# Patient Record
Sex: Female | Born: 1940 | Race: White | Hispanic: No | Marital: Married | Smoking: Never smoker
Health system: Southern US, Community
[De-identification: ages and names within clinical notes are randomized; demographics above are authoritative.]

## PROBLEM LIST (undated history)

## (undated) DIAGNOSIS — F329 Major depressive disorder, single episode, unspecified: Secondary | ICD-10-CM

## (undated) DIAGNOSIS — E119 Type 2 diabetes mellitus without complications: Secondary | ICD-10-CM

## (undated) DIAGNOSIS — F32A Depression, unspecified: Secondary | ICD-10-CM

## (undated) DIAGNOSIS — E079 Disorder of thyroid, unspecified: Secondary | ICD-10-CM

## (undated) DIAGNOSIS — M549 Dorsalgia, unspecified: Secondary | ICD-10-CM

## (undated) DIAGNOSIS — M1990 Unspecified osteoarthritis, unspecified site: Secondary | ICD-10-CM

## (undated) DIAGNOSIS — H269 Unspecified cataract: Secondary | ICD-10-CM

## (undated) DIAGNOSIS — I10 Essential (primary) hypertension: Secondary | ICD-10-CM

## (undated) HISTORY — PX: OTHER SURGICAL HISTORY: SHX169

## (undated) HISTORY — DX: Depression, unspecified: F32.A

## (undated) HISTORY — PX: ABDOMINAL HYSTERECTOMY: SHX81

## (undated) HISTORY — DX: Unspecified cataract: H26.9

## (undated) HISTORY — PX: EYE SURGERY: SHX253

## (undated) HISTORY — DX: Essential (primary) hypertension: I10

## (undated) HISTORY — DX: Type 2 diabetes mellitus without complications: E11.9

## (undated) HISTORY — PX: THYROID SURGERY: SHX805

## (undated) HISTORY — DX: Unspecified osteoarthritis, unspecified site: M19.90

## (undated) HISTORY — PX: SHOULDER SURGERY: SHX246

## (undated) HISTORY — DX: Dorsalgia, unspecified: M54.9

## (undated) HISTORY — DX: Disorder of thyroid, unspecified: E07.9

## (undated) SURGERY — ESOPHAGOGASTRODUODENOSCOPY (EGD) WITH PROPOFOL
Anesthesia: Monitor Anesthesia Care

---
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
## 2003-12-23 ENCOUNTER — Ambulatory Visit (HOSPITAL_COMMUNITY): Admission: RE | Admit: 2003-12-23 | Discharge: 2003-12-23 | Payer: Self-pay | Admitting: Pulmonary Disease

## 2004-03-13 ENCOUNTER — Ambulatory Visit (HOSPITAL_COMMUNITY): Admission: RE | Admit: 2004-03-13 | Discharge: 2004-03-13 | Payer: Self-pay | Admitting: Pulmonary Disease

## 2004-07-02 ENCOUNTER — Ambulatory Visit: Payer: Self-pay | Admitting: Orthopedic Surgery

## 2004-07-30 ENCOUNTER — Ambulatory Visit: Payer: Self-pay | Admitting: Orthopedic Surgery

## 2004-08-07 ENCOUNTER — Encounter: Admission: RE | Admit: 2004-08-07 | Discharge: 2004-08-07 | Payer: Self-pay | Admitting: Orthopedic Surgery

## 2004-08-21 ENCOUNTER — Encounter: Admission: RE | Admit: 2004-08-21 | Discharge: 2004-08-21 | Payer: Self-pay | Admitting: Orthopedic Surgery

## 2004-08-31 ENCOUNTER — Ambulatory Visit: Payer: Self-pay | Admitting: Cardiology

## 2004-09-04 ENCOUNTER — Encounter: Admission: RE | Admit: 2004-09-04 | Discharge: 2004-09-04 | Payer: Self-pay | Admitting: Orthopedic Surgery

## 2004-09-21 ENCOUNTER — Ambulatory Visit: Payer: Self-pay | Admitting: Cardiology

## 2004-09-25 ENCOUNTER — Inpatient Hospital Stay (HOSPITAL_BASED_OUTPATIENT_CLINIC_OR_DEPARTMENT_OTHER): Admission: RE | Admit: 2004-09-25 | Discharge: 2004-09-25 | Payer: Self-pay | Admitting: Cardiology

## 2004-09-25 ENCOUNTER — Ambulatory Visit: Payer: Self-pay | Admitting: Cardiology

## 2004-10-02 ENCOUNTER — Ambulatory Visit: Payer: Self-pay | Admitting: Cardiology

## 2004-10-12 ENCOUNTER — Ambulatory Visit: Payer: Self-pay | Admitting: Orthopedic Surgery

## 2005-01-14 ENCOUNTER — Ambulatory Visit (HOSPITAL_COMMUNITY): Admission: RE | Admit: 2005-01-14 | Discharge: 2005-01-14 | Payer: Self-pay | Admitting: Neurosurgery

## 2005-02-24 ENCOUNTER — Encounter: Admission: RE | Admit: 2005-02-24 | Discharge: 2005-02-24 | Payer: Self-pay | Admitting: Endocrinology

## 2005-02-24 ENCOUNTER — Other Ambulatory Visit: Admission: RE | Admit: 2005-02-24 | Discharge: 2005-02-24 | Payer: Self-pay | Admitting: Interventional Radiology

## 2005-05-27 ENCOUNTER — Inpatient Hospital Stay (HOSPITAL_COMMUNITY): Admission: RE | Admit: 2005-05-27 | Discharge: 2005-05-29 | Payer: Self-pay | Admitting: General Surgery

## 2005-05-27 ENCOUNTER — Encounter (INDEPENDENT_AMBULATORY_CARE_PROVIDER_SITE_OTHER): Payer: Self-pay | Admitting: Specialist

## 2007-01-17 ENCOUNTER — Encounter: Admission: RE | Admit: 2007-01-17 | Discharge: 2007-01-17 | Payer: Self-pay | Admitting: Neurosurgery

## 2009-07-14 ENCOUNTER — Encounter: Admission: RE | Admit: 2009-07-14 | Discharge: 2009-07-14 | Payer: Self-pay | Admitting: Neurosurgery

## 2011-01-08 NOTE — Op Note (Signed)
NAME:  Fuller, Wendy                   ACCOUNT NO.:  [id number]   MEDICAL RECORD NO.:  [id number]          PATIENT TYPE:  AMB   LOCATION:  SDS                          FACILITY:  [facility]   PHYSICIAN:  Adolph Pollack, M.D.DATE OF BIRTH:  1940-10-02   DATE OF PROCEDURE:  05/27/2005  DATE OF DISCHARGE:                                 OPERATIVE REPORT   PREOPERATIVE DIAGNOSIS:  Follicular lesion, left thyroid gland.   POSTOPERATIVE DIAGNOSIS:  Follicular lesion, left thyroid gland.   PROCEDURE:  Left thyroidectomy.   SURGEON:  Adolph Pollack, M.D.   ASSISTANT:  Gabrielle Dare. Janee Morn, M.D.   ANESTHESIA:  General.   INDICATIONS:  Wendy Fuller is a 70 year old female with cervical radiculopathy  requiring cervical surgery. An MRI of her neck also demonstrated a thyroid  mass. This was biopsied and showed some follicular cells. No obvious  malignancy. She now presents for combined procedure of a left thyroidectomy  and the cervical surgery per Dr. Venetia Maxon.   TECHNIQUE:  She is seen in the holding area and the left side of the neck  marked with my initials. She was then brought to the operating room, placed  supine on the operating table, and a general anesthetic was administered.  Dr. Venetia Maxon positioned her into slight neck extension. The neck was then  sterilely prepped and draped. A transverse incision was made from the mid  portion of the left sternocleidomastoid muscle cross-tracing around just to  the right of the midline. The subcutaneous tissue and platysma muscle were  divided. Subplatysmal flaps were then made superiorly to the laryngeal  cartilage and inferiorly to the suprasternal notch. Next to the superficial  area, the deep cervical fascia between the strap muscles was then identified  and divided. The left strap muscles were dissected free from the underlying  thyroid gland and the enlarged thyroid mass/lesion was palpated inferiorly.  I began by mobilizing the inferior aspect  of the thyroid gland with  dissection on the thyroid capsule and dividing the inferior thyroidal  vessels between clips. Following this, I then approached the superior pole  of the thyroid gland with dissection on the thyroid capsule. I divided the  vessels low on the thyroid gland and was able to then mobilize the superior  aspect of thyroid gland. I then identified the middle thyroidal vessels. I  identified the superior and inferior parathyroid glands and preserved the  lateral blood supply while dissecting them free from the thyroid gland. The  recurrent laryngeal nerve was then identified and traced up to its insertion  to the cricothyroid muscle. I then divided the middle thyroidal vessels  between clips and then dissected the left thyroid gland off the trachea. I  clamped the isthmus at its adjoining with the right thyroid gland and then  resected the isthmus and the left thyroid. The isthmus was oversewn with a 3-  0 Vicryl suture and was hemostatic. Specimen was sent for pathology and a  hyperplastic lesion was noted but no evidence of malignancy.   There was some bleeding just adjacent to the  recurrent laryngeal nerve which  was controlled with direct pressure and Surgicel. Following this, I then  reapproximated the strap muscles with interrupted 3-0 Vicryl sutures. There  was some bleeding from one of the anterior jugular veins during the closure  and I removed the suture and then ligated  that vein, and then reapproximated the strap muscles. At this point,  hemostasis was adequate. Dr. Venetia Maxon then proceeded with his operation which  will be dictated in the second note. There were no apparent complications  during her left thyroid lobectomy.      Adolph Pollack, M.D.  Electronically Signed     TJR/MEDQ  D:  05/27/2005  T:  05/27/2005  Job:  [id number]   cc:   Danae Orleans. Venetia Maxon, M.D.  Fax: [phone]

## 2011-01-08 NOTE — Cardiovascular Report (Signed)
NAMEJohnette, Wendy Fuller                   ACCOUNT NO.:  [id number]   MEDICAL RECORD NO.:  [id number]          PATIENT TYPE:  OIB   LOCATION:  6501                         FACILITY:  [facility]   PHYSICIAN:  Rollene Rotunda, M.D.   DATE OF BIRTH:  1941-03-24   DATE OF PROCEDURE:  09/25/2004  DATE OF DISCHARGE:                              CARDIAC CATHETERIZATION   PRIMARY CARE PHYSICIAN:  Doreen Beam, M.D.   REASON FOR PRESENTATION:  Evaluate patient with chest pain suggestive of  unstable angina.   PROCEDURE NOTE:  Left heart catheterization was performed via the right  femoral artery.  The artery was cannulated using anterior wall puncture.  A  #4-French arterial sheath was inserted via the modified Seldinger technique.  Preformed Judkins and a pigtail catheter were utilized.  The patient  tolerated the procedure well and left the laboratory in stable condition.   RESULTS:  HEMODYNAMICS:  LV 147/16.  AO 149/75.   CORONARIES:  The left main was normal.   The LAD was normal.  There was a large first diagonal which was normal.  A  second diagonal was moderate size and normal.  The AV groove was normal.  The ramus intermediate was large and normal.  OM1 was large and normal.  OM2  was large and normal.  The posterolateral was small and normal.   The right coronary artery was a large, dominant vessel and normal throughout  its course.  There was a small PDA which was normal.  Posterolateral was  small and normal.   LEFT VENTRICULOGRAM:  The left ventriculogram was obtained in the RAO  projection.  The EF was 55% with normal wall motion.   CONCLUSION:  Normal coronaries.  Low normal left ventricular function.   PLAN:  No further cardiac work-up is planned.  Patient will follow up with  her primary care doctor for work up of probable nonanginal etiologies of  chest pain.      JH/MEDQ  D:  09/25/2004  T:  09/25/2004  Job:  [id number]

## 2011-01-08 NOTE — Op Note (Signed)
NAMEMarilla Fuller, Wendy Fuller                   ACCOUNT NO.:  [id number]   MEDICAL RECORD NO.:  [id number]          PATIENT TYPE:  OIB   LOCATION:  3023                         FACILITY:  [facility]   PHYSICIAN:  Danae Orleans. Venetia Maxon, M.D.  DATE OF BIRTH:  1941/05/20   DATE OF PROCEDURE:  05/27/2005  DATE OF DISCHARGE:                                 OPERATIVE REPORT   PREOPERATIVE DIAGNOSES:  1.  Herniated cervical disk with,  2.  Cervical spondylosis,  3.  Degenerative disk disease; and,  4.  Radiculopathy of cervical 5-6 and cervical 6-7 levels.   POSTOPERATIVE DIAGNOSES:  1.  Herniated cervical disk with,  2.  Cervical spondylosis.  3.  Degenerative disk disease; and,  4.  Radiculopathy of cervical 5-6 and cervical 6-7 levels.   OPERATION PERFORMED:  Anterior cervical decompression and fusion, cervical 5-  6 and cervical 6-7 levels with Peak interbody cages with morcellized bone  autograft, and anterior cervical plate.   SURGEON:  Danae Orleans. Venetia Maxon, M.D.   ASSISTANT:  Clydene Fake, M.D.   ANESTHESIA:  General endotracheal anesthesia.   ESTIMATED BLOOD LOSS:  The estimated blood loss was 50 mL.   COMPLICATIONS:  None.   DISPOSITION:  To recovery.   INDICATIONS:  Wendy Fuller is a 70 year old woman with severe cervical  spondylosis and left upper extremity pain with foraminal stenosis at C5-6  and C6-7  levels.  She has some mild degenerative changes at C4-5 and C3-4,  but these were felt not to be severe enough to warranted surgical  intervention at this point.  She also has a thyroid mass and it was elected  to perform thyroid lobectomy, which Dr. Abbey Chatters performed and will be  dictated separately at the same time as the anterior cervical decompression  and fusion.   DESCRIPTION OF OPERATION:  After the satisfactory and uncomplicated  induction of general endotracheal anesthesia the patient was placed on a  horseshoe head holder with her neck in slight extension and she was placed  in 10 pounds of Halter traction.  Then I met with Dr. Abbey Chatters, we  conferred on placement of incision and then he performed his exposure.  After the thyroid lobectomy was performed, I made an opening along the  anterior border of the sternocleidomastoid muscle and gained access in the  usual fashion on the left side of the midline to the anterior cervical  spine.  A bent spinal needle was placed at the C5-6 level and intraoperative  x-ray confirmed this to be the C5-6 level.  Subsequently the longus coli  muscles were taken down from the anterior cervical spine using  electrocautery and Key elevator from the C5 through C7 levels, and  __________  retractor was placed along with up-down retractor to facilitate  exposure.   The C5-6 and C6-7 levels were highly degenerated.  The ventral osteophytes  were removed with the Leksell rongeurs.  The disk spaces were markedly  collapsed, particularly the C5-6 level.  After the disk spaces were incised  at each level and disk material was removed with a  variety of pituitary  rongeurs and Carlens curets the disk space spreader was placed initially at  the C6-7 level where highly degenerated disk space was __________  to  residual disk material and similarly at the C5-6 level.   Subsequently the microscope was brought into the field.  Using the high-  speed drill endplates at C6 and C7 were decorticated and uncinate spurs were  drilled down.  A 2 mm gold tip Kerrison rongeur was used to remove  additional bone and ligamentous tissue, and the C7 nerve root, particularly  on the left, was widely decompressed.  The C7 nerve root on the right was  also decompressed as was the central spinal cord dura.  Hemostasis was  obtained with Gelfoam soaked in thrombin, and subsequently after trial  sizing a 7 mm Peak interbody cage was selected, packed with morcellized bone  autograft, which was retained from the drillings of the endplates and packed  within  the center of the graft.  This was done and inserted in the  interspace and countersunk appropriately.  Attention was then turned to the  C5-6 level where similar decompression was performed, and again a large  uncinate spur was drilled down with resultant significant decompression of  the C6 nerve root, particularly on the left, and the C6 nerve root was also  decompressed on the right.  The central spinal cord dura was decompressed as  well.  Similarly sized interbody Peak cage was packed with morcellized bone  autograft, tamped into the interspace and countersunk appropriately.  Subsequently the Halter traction was removed.  A 31 mm anterior cervical  plate was then selected and affixed to the anterior cervical spine using  variable angled 14-mm screws; two at C5, two at C6  and two C7.  All screws  had excellent purchase.  Locking mechanisms were engaged after final x-ray  demonstrated well positioned anterior cervical plate and screws.   The wound was then copiously irrigated with bacitracin and saline.  The soft  tissues were inspected and found to be in good repair.  The platysmal layer  was then reapproximated with 3-0 Vicryl sutures.  The skin edges were  reapproximated with interrupted 3-0 Vicryl subcuticular stitch.  The wound  was dressed with benzoin, Steri-strips, telfa, gauze, and tape.   The patient was extubated in the operating room and taken to the recovery  room in stable and satisfactory condition having tolerated the operation  well.   COUNTS:  The counts were correct at the end of the case.      Danae Orleans. Venetia Maxon, M.D.  Electronically Signed     JDS/MEDQ  D:  05/27/2005  T:  05/27/2005  Job:  [id number]

## 2011-05-25 DIAGNOSIS — R079 Chest pain, unspecified: Secondary | ICD-10-CM

## 2011-06-08 ENCOUNTER — Other Ambulatory Visit (HOSPITAL_COMMUNITY): Payer: Self-pay | Admitting: Neurosurgery

## 2011-06-08 DIAGNOSIS — M542 Cervicalgia: Secondary | ICD-10-CM

## 2011-06-08 DIAGNOSIS — M545 Low back pain: Secondary | ICD-10-CM

## 2011-06-08 DIAGNOSIS — M546 Pain in thoracic spine: Secondary | ICD-10-CM

## 2011-06-24 ENCOUNTER — Other Ambulatory Visit (HOSPITAL_COMMUNITY): Payer: Self-pay | Admitting: Neurosurgery

## 2011-06-24 DIAGNOSIS — M542 Cervicalgia: Secondary | ICD-10-CM

## 2011-06-24 DIAGNOSIS — M545 Low back pain: Secondary | ICD-10-CM

## 2011-06-24 DIAGNOSIS — M546 Pain in thoracic spine: Secondary | ICD-10-CM

## 2011-06-25 ENCOUNTER — Other Ambulatory Visit (HOSPITAL_COMMUNITY): Payer: Medicare Other

## 2011-07-09 ENCOUNTER — Other Ambulatory Visit: Payer: Self-pay | Admitting: Neurosurgery

## 2011-07-12 ENCOUNTER — Ambulatory Visit (HOSPITAL_COMMUNITY)
Admission: RE | Admit: 2011-07-12 | Discharge: 2011-07-12 | Disposition: A | Discharge status: Home / Self Care | Payer: Medicare Other | Source: Ambulatory Visit | Attending: Neurosurgery | Admitting: Neurosurgery

## 2011-07-12 DIAGNOSIS — M542 Cervicalgia: Secondary | ICD-10-CM

## 2011-07-12 DIAGNOSIS — M545 Low back pain: Secondary | ICD-10-CM

## 2011-07-12 DIAGNOSIS — M546 Pain in thoracic spine: Secondary | ICD-10-CM

## 2011-07-12 DIAGNOSIS — M48061 Spinal stenosis, lumbar region without neurogenic claudication: Secondary | ICD-10-CM | POA: Insufficient documentation

## 2011-07-12 DIAGNOSIS — M519 Unspecified thoracic, thoracolumbar and lumbosacral intervertebral disc disorder: Secondary | ICD-10-CM | POA: Insufficient documentation

## 2011-07-12 DIAGNOSIS — M4802 Spinal stenosis, cervical region: Secondary | ICD-10-CM | POA: Insufficient documentation

## 2011-07-12 DIAGNOSIS — Z981 Arthrodesis status: Secondary | ICD-10-CM | POA: Insufficient documentation

## 2011-07-12 DIAGNOSIS — M549 Dorsalgia, unspecified: Secondary | ICD-10-CM | POA: Insufficient documentation

## 2011-07-12 DIAGNOSIS — M4120 Other idiopathic scoliosis, site unspecified: Secondary | ICD-10-CM | POA: Insufficient documentation

## 2011-07-12 DIAGNOSIS — M5020 Other cervical disc displacement, unspecified cervical region: Secondary | ICD-10-CM | POA: Insufficient documentation

## 2011-07-12 MED ORDER — IOHEXOL 300 MG/ML  SOLN
10.0000 mL | Freq: Once | INTRAMUSCULAR | Status: AC | PRN
  Administered 2011-07-12: 10 mL via INTRATHECAL

## 2011-07-12 MED ORDER — OXYCODONE-ACETAMINOPHEN 5-325 MG PO TABS
ORAL_TABLET | ORAL | Status: AC
  Filled 2011-07-12: qty 1

## 2011-07-12 MED ORDER — ONDANSETRON HCL 4 MG/2ML IJ SOLN: 4.0000 mg | Freq: Four times a day (QID) | INTRAMUSCULAR | Status: DC | PRN

## 2011-07-12 MED ORDER — OXYCODONE-ACETAMINOPHEN 5-325 MG PO TABS
ORAL_TABLET | ORAL | Status: AC
  Administered 2011-07-12: 1 via ORAL
  Filled 2011-07-12: qty 1

## 2011-07-12 MED ORDER — OXYCODONE-ACETAMINOPHEN 5-325 MG PO TABS
1.0000 | ORAL_TABLET | ORAL | Status: DC | PRN
  Administered 2011-07-12 (×2): 1 via ORAL

## 2011-07-12 MED ORDER — DIAZEPAM 5 MG PO TABS
10.0000 mg | ORAL_TABLET | Freq: Once | ORAL | Status: AC
  Administered 2011-07-12: 10 mg via ORAL

## 2011-07-12 NOTE — Procedures (Signed)
Lumbar puncture L4/5. Omnipaque 300.

## 2011-11-12 ENCOUNTER — Ambulatory Visit (INDEPENDENT_AMBULATORY_CARE_PROVIDER_SITE_OTHER): Payer: Medicare Other | Admitting: Urology

## 2011-11-12 DIAGNOSIS — N952 Postmenopausal atrophic vaginitis: Secondary | ICD-10-CM

## 2011-11-12 DIAGNOSIS — R300 Dysuria: Secondary | ICD-10-CM

## 2012-02-11 ENCOUNTER — Ambulatory Visit (INDEPENDENT_AMBULATORY_CARE_PROVIDER_SITE_OTHER): Payer: Medicare Other | Admitting: Urology

## 2012-02-11 DIAGNOSIS — N3941 Urge incontinence: Secondary | ICD-10-CM

## 2012-02-11 DIAGNOSIS — N952 Postmenopausal atrophic vaginitis: Secondary | ICD-10-CM

## 2012-05-12 ENCOUNTER — Ambulatory Visit: Payer: Medicare Other | Admitting: Urology

## 2012-08-04 ENCOUNTER — Ambulatory Visit: Payer: Medicare Other | Admitting: Urology

## 2012-08-11 ENCOUNTER — Ambulatory Visit: Payer: Medicare Other | Admitting: Urology

## 2012-11-30 IMAGING — CT CT L SPINE W/ CM
3 of 12 series · 10 of 34 positions shown, 12 images · IV contrast (omnipaque)
Comparison: Cervical thoracic and lumbar [REDACTED] radiographs 06/07/2011 and earlier. Lumbar MRI
07/14/2009.

MYELOGRAM CERVICAL AND THORACIC AND LUMBAR

CLINICAL DATA: 70-year-old female with neck and back pain.
Scoliosis.
TECHNIQUE: Intrathecal contrast was administered by Dr. Roshell
Maxfield  via lumbar puncture at the L4-L5 level. Following injection
of intrathecal Omnipaque contrast, spine imaging in multiple
projections was performed using fluoroscopy.

Fluoroscopy Time: 1.5 minutes.
TECHNIQUE: CT imaging of the cervical spine was performed after
intrathecal contrast administration.  Multiplanar CT image
reconstructions were also generated.
TECHNIQUE: CT imaging of the thoracic spine was performed after
TECHNIQUE: CT imaging of the lumbar spine was performed after

[Series 2: l-spine · axial · 0.30mm/px · z∈[-161,+51]mm · 2 of 86 slices shown, 3 images]
[im 1/86  soft-tissue]
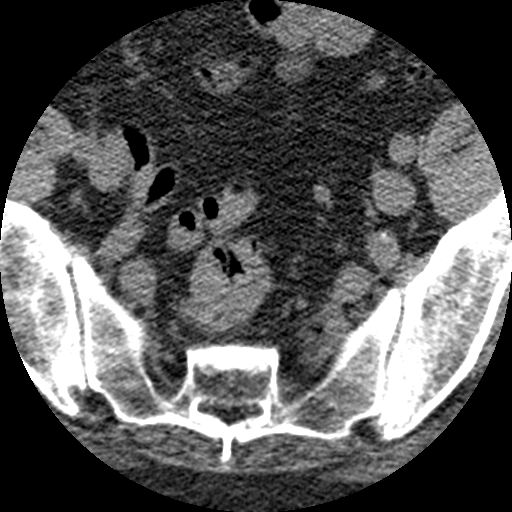
[im 1/86  bone]
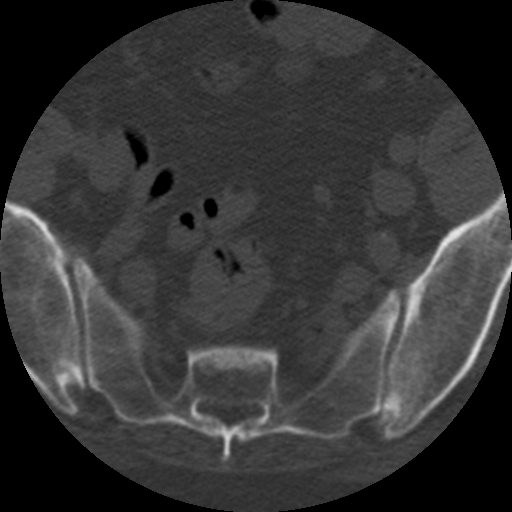
[im 86/86  bone]
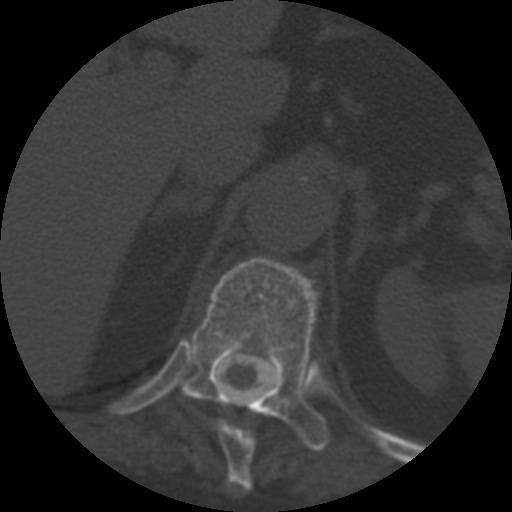

[Series 103: sagittal detail · sagittal · 0.43mm/px · 5 of 33 slices shown, 6 images]
[im 11/33  bone]
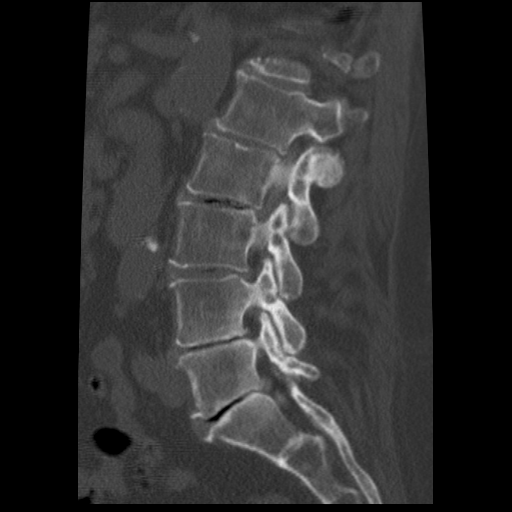
[im 14/33  bone]
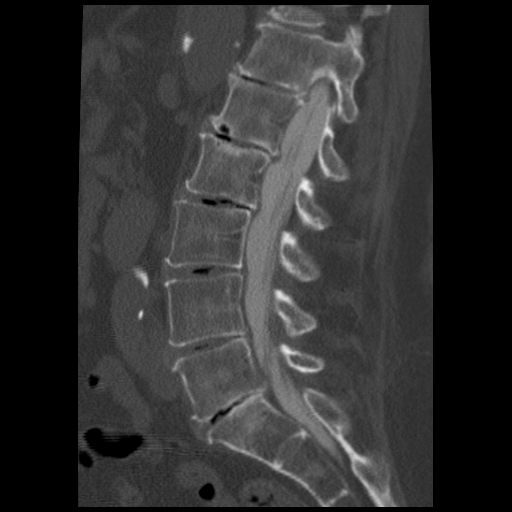
[im 17/33  soft-tissue]
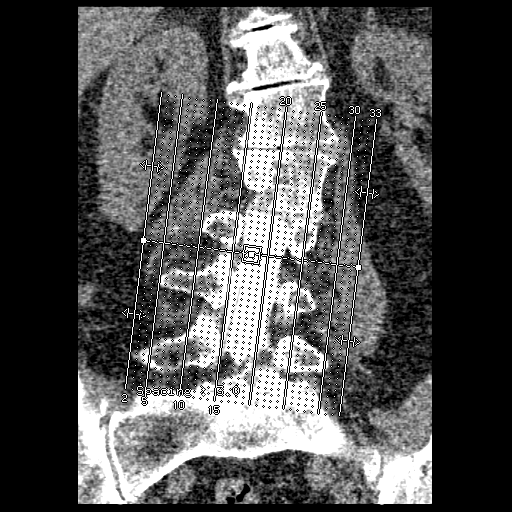
[im 17/33  bone]
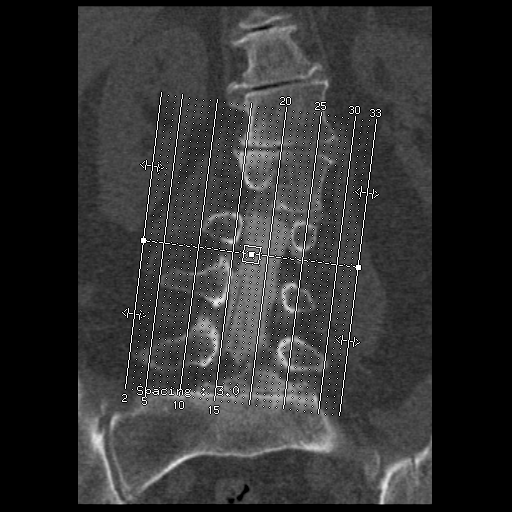
[im 19/33  bone]
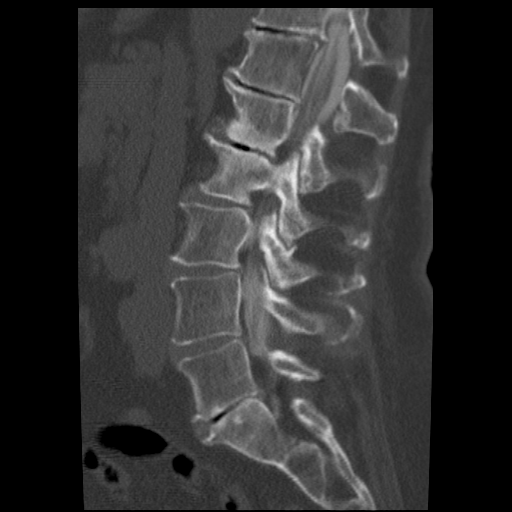
[im 22/33  bone]
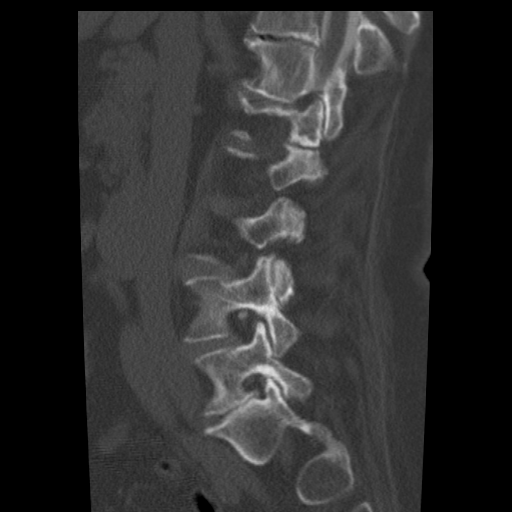

[Series 104: coronal detail · coronal · 0.43mm/px · 3 of 39 slices shown]
[im 8/39  bone]
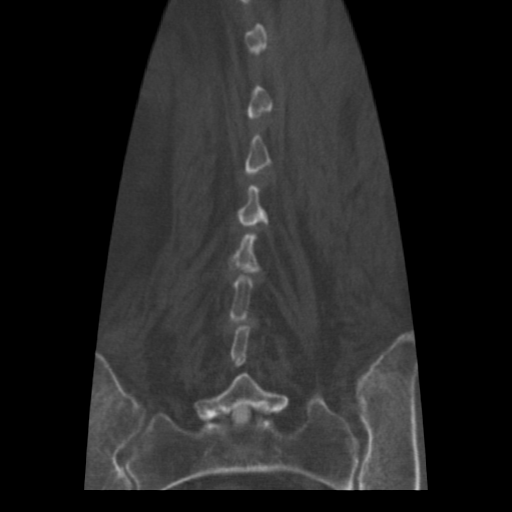
[im 16/39  bone]
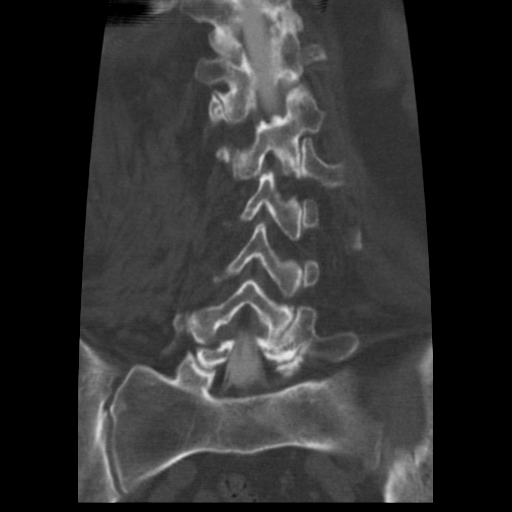
[im 23/39  bone]
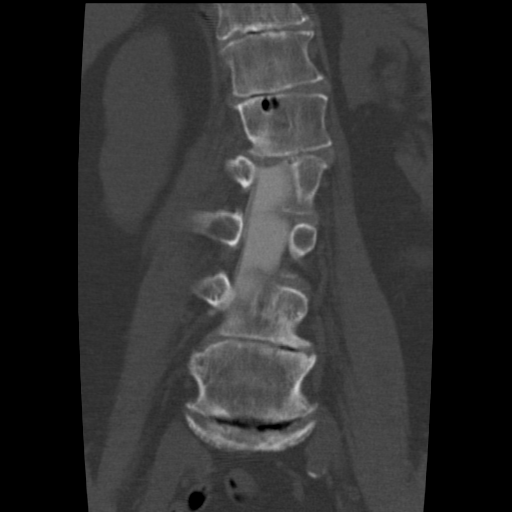

[10 of 34 positions shown; findings below may reference images not displayed]

FINDINGS: Stable levoconvex lumbar scoliosis.  Comparatively mild
dextroconvex thoracic scoliosis.  Adequate intrathecal contrast.

There are defects on the ventral thecal sac throughout the lumbar
spine, most pronounced at L1-L2 and L4-L5 associated with disc
space loss and intermittent vacuum disc phenomena.  Subsequent
spinal stenosis at L1-L2 could be as much as moderate.  No
significant lumbar spinal stenosis is suspected elsewhere.

Myelographic evaluation of the thoracic spine was limited due to
thoracic kyphosis which prevented significant thoracic  thecal sac
accumulation of contrast with the patient prone.

Prior cervical ACDF at C5-C6 and C6-C7.  Prominent endplate
spurring at C4-C5 with mass effect on the ventral thecal sac and
probably mild subsequent spinal stenosis.  Elsewhere the cervical
thecal sac appears widely patent.
IMPRESSION: 1.  Stable scoliosis, most pronounced in the lumbar spine and
associated with widespread chronic disc degeneration.
2.  Cervical ACDF at C5-C6 and C6-C7.  Suspect adjacent segment
disease at C4-C5 with mild cervical spinal stenosis.
3.  Mild lumbar spinal stenosis suspected at L1-L2 related to disc
disease.
4. See post myelogram CT findings below.

CT MYELOGRAPHY CERVICAL SPINE
FINDINGS: Subarachnoid and intrathecal contrast as expected.
Prior cervical ACDF.  Sequelae of left hemithyroidectomy.  Mild
carotid calcified atherosclerosis.  Otherwise negative visualized
paraspinal soft tissues.  Negative lung apices.

C2-C3:  Negative.

C3-C4:  Negative.

C4-C5:  Severe disc space loss and mild vacuum disc phenomena.
Mild circumferential disc osteophyte complex effaces the ventral
CSF space without significant spinal stenosis (residual AP thecal
sac 10-11 mm).  There is a superimposed bilateral facet
hypertrophy.  Mild to moderate bilateral C5 foraminal stenosis.

C5-C6:  ACDF.  Intact hardware.  Scant interbody arthrodesis,
primarily far laterally on the right.  No spinal stenosis.  Left
greater than right uncovertebral hypertrophy with mild to moderate
left C6 foraminal stenosis.

C6-C7:  ACDF.  Intact hardware.  There does appear to be central
interbody arthrodesis partially obscured by streak artifact.  No
spinal stenosis.  Mild bilateral uncovertebral and facet
hypertrophy.  Mild to moderate left C7 foraminal stenosis.

C7-T1:  Trace anterolisthesis.  Moderate to severe bilateral facet
hypertrophy.  No spinal stenosis.  Moderate bilateral C8 foraminal
stenosis.
IMPRESSION: 1.  ACDF at C5-C6 and C6-C7 without hardware loosening.  Evaluation
of interbody fusion somewhat degraded by streak artifact, with
adequate arthrodesis suspected at both levels.
2.    Adjacent segment disease at C4-C5 with mild to moderate
bilateral C5 foraminal stenosis but no significant spinal stenosis.
3.  Mild anterolisthesis of C7 on T1 associated with facet
degeneration and moderate bilateral C8 foraminal stenosis.
4.  See thoracic and lumbar post myelogram CT findings below.

CT MYELOGRAPHY THORACIC SPINE
FINDINGS: Visualized paraspinal soft tissues are within normal
limits.  Calcified atherosclerosis of the thoracic aorta.  Negative
visualized lung parenchyma.

Stable thoracic vertebral height and alignment with relatively mild
dextroconvex scoliosis.

T1-T2: Disc space loss and circumferential disc bulge effaces the
ventral CSF space without spinal stenosis.  Mild facet hypertrophy.
No foraminal stenosis.

T2-T3: Trace anterolisthesis.  Mild facet hypertrophy.  No
stenosis.

T3-T4: Negative.

T4-T5: Negative.

T5-T6: Negative.

T6-T7: Minimal circumferential disc bulge.  Otherwise negative.

T7-T8: Negative.

T8-T9: Minimal to mild circumferential disc bulge.  No stenosis.

T9-T10: Negative except for vacuum disc phenomena.

T10-T11: Minimal to mild circumferential disc bulge.  No stenosis.

T11-T12: Vacuum disc phenomenon with mild circumferential disc
bulge eccentric to the left.  Mild left facet hypertrophy.  No
significant stenosis.

T12-L1:  Vacuum disc phenomenon with mild circumferential disc
bulge.  Vacuum L1 superior endplate Schmorl's node.  Mild to
moderate facet hypertrophy.  No significant stenosis.
IMPRESSION: 1.  Thoracic spine degenerative changes and mild dextroconvex
scoliosis without significant spinal or foraminal stenosis.
2.See lumbar post myelogram CT findings below.

CT MYELOGRAPHY LUMBAR SPINE
FINDINGS: Moderate levoconvex lumbar scoliosis with rotatory
component appears stable.  Mild for age calcified atherosclerosis
of the aorta and visualized iliac arteries. Visualized paraspinal
soft tissues are within normal limits.

Normal appearance of the conus medullaris at T12-L1.  Cauda equina
nerve roots are relatively clumped, but this appears related to the
scoliosis.

T12-L1:  Vacuum disc phenomenon with mild circumferential disc
bulge.  Vacuum L1 superior endplate Schmorl's node.  Mild to
moderate facet hypertrophy.  No significant stenosis.

L1-L2:  Vacuum disc phenomena.  Mild circumferential disc
osteophyte complex.  Moderate right facet hypertrophy.  No spinal
stenosis.  Mild to moderate right L1 foraminal stenosis.

L2-L3:  Vacuum disc phenomena.  Mild circumferential disc
osteophyte complex.  Mild facet hypertrophy. Ligament flavum
hypertrophy on the right; this was the site of the small synovial
cyst identified on the prior MRI which is not evident by CT (and
therefore probably stable or regressed).  No significant stenosis.

L3-L4:  Vacuum disc phenomena.  Mild circumferential disc
osteophyte complex.  Mild facet hypertrophy.  No significant
stenosis.

L4-L5:  Mild vacuum disc phenomena.  Mild circumferential disc
osteophyte complex.  Mild to moderate facet hypertrophy.  Mild left
L4 foraminal stenosis.

L5-S1:  Vacuum disc phenomena.  Mild circumferential disc
osteophyte complex.  Mild facet hypertrophy.  Moderate left greater
than right L5 foraminal stenosis.  No spinal or lateral recess
stenosis.
IMPRESSION: Chronic levoconvex lumbar scoliosis with diffuse chronic lumbar
disc and facet degeneration, but no significant spinal or foraminal
stenosis.

## 2012-12-01 ENCOUNTER — Ambulatory Visit (INDEPENDENT_AMBULATORY_CARE_PROVIDER_SITE_OTHER): Payer: Medicare Other | Admitting: Urology

## 2012-12-01 DIAGNOSIS — N952 Postmenopausal atrophic vaginitis: Secondary | ICD-10-CM

## 2012-12-01 DIAGNOSIS — Z87440 Personal history of urinary (tract) infections: Secondary | ICD-10-CM

## 2013-05-04 ENCOUNTER — Encounter (INDEPENDENT_AMBULATORY_CARE_PROVIDER_SITE_OTHER): Payer: Self-pay | Admitting: *Deleted

## 2013-05-31 ENCOUNTER — Ambulatory Visit (INDEPENDENT_AMBULATORY_CARE_PROVIDER_SITE_OTHER): Payer: Medicare Other | Admitting: Internal Medicine

## 2013-06-07 ENCOUNTER — Ambulatory Visit (INDEPENDENT_AMBULATORY_CARE_PROVIDER_SITE_OTHER): Payer: Medicare Other | Admitting: Internal Medicine

## 2013-07-17 ENCOUNTER — Encounter (INDEPENDENT_AMBULATORY_CARE_PROVIDER_SITE_OTHER): Payer: Self-pay | Admitting: *Deleted

## 2013-07-17 ENCOUNTER — Ambulatory Visit (INDEPENDENT_AMBULATORY_CARE_PROVIDER_SITE_OTHER): Payer: Medicare Other | Admitting: Internal Medicine

## 2013-07-17 ENCOUNTER — Encounter (INDEPENDENT_AMBULATORY_CARE_PROVIDER_SITE_OTHER): Payer: Self-pay | Admitting: Internal Medicine

## 2013-07-17 ENCOUNTER — Other Ambulatory Visit (INDEPENDENT_AMBULATORY_CARE_PROVIDER_SITE_OTHER): Payer: Self-pay | Admitting: *Deleted

## 2013-07-17 VITALS — BP 108/62 | HR 56 | Temp 98.2°F | Ht 63.5 in | Wt 165.4 lb

## 2013-07-17 DIAGNOSIS — R1310 Dysphagia, unspecified: Secondary | ICD-10-CM

## 2013-07-17 DIAGNOSIS — E119 Type 2 diabetes mellitus without complications: Secondary | ICD-10-CM | POA: Insufficient documentation

## 2013-07-17 DIAGNOSIS — I10 Essential (primary) hypertension: Secondary | ICD-10-CM | POA: Insufficient documentation

## 2013-07-17 NOTE — Progress Notes (Signed)
Subjective:     Patient ID: Wendy Fuller, female   DOB: Jun 19, 1941, 72 y.o.   MRN: [id number]  HPI Referred to our office by Dr.Vyas for dysphagia. She tells me foods are lodging in her esophagus. Symptoms for a couple of years. Meats will lodge. She also has trouble with cornbread.  Does not occur every day.  EGD 2013 Dr. Teena Dunk: Hiatal hernia , Schatzki';s ring. (dysphagia) Appetite is good. No weight loss.  No abdominal pain. Usually has a BM twice a day.     Family hx of esophageal cancer. (mother) (Mother was an alcoholic) Father deceased from colon cancer Retired from [employer]. Four children. One has Parkinson's disease. She is married. Diabetic x 3 yrs. Review of Systems see hpi Current Outpatient Prescriptions  Medication Sig Dispense Refill  . fentaNYL (DURAGESIC - DOSED MCG/HR) 25 MCG/HR patch Place 25 mcg onto the skin every 3 (three) days.      Marland Kitchen gabapentin (NEURONTIN) 300 MG capsule Take 300 mg by mouth 3 (three) times daily.        Marland Kitchen glimepiride (AMARYL) 4 MG tablet Take 4 mg by mouth daily before breakfast.        . lisinopril-hydrochlorothiazide (PRINZIDE,ZESTORETIC) 10-12.5 MG per tablet Take 1 tablet by mouth daily.        . metFORMIN (GLUCOPHAGE) 500 MG tablet Take by mouth 2 (two) times daily with a meal.      . methocarbamol (ROBAXIN) 500 MG tablet Take 500 mg by mouth 3 (three) times daily.        Marland Kitchen oxymorphone (OPANA) 5 MG tablet Take 5 mg by mouth as needed for pain.      . ranitidine (ZANTAC) 300 MG capsule Take 300 mg by mouth as needed for heartburn.       No current facility-administered medications for this visit.   Past Medical History  Diagnosis Date  . Diabetes     x 3 yrs  . Back pain   . Hypertension    Past Surgical History  Procedure Laterality Date  . Complete hyster    . Thyroid surgery    . Shoulder surgery     Allergies  Allergen Reactions  . Penicillins Hives        Objective:   Physical Exam  Filed Vitals:   07/17/13 0958  BP: 108/62  Pulse: 56  Temp: 98.2 F (36.8 C)  Height: 5' 3.5" (1.613 m)  Weight: 165 lb 6.4 oz (75.025 kg)  Alert and oriented. Skin warm and dry. Oral mucosa is moist.   . Sclera anicteric, conjunctivae is pink. Thyroid not enlarged. No cervical lymphadenopathy. Lungs clear. Heart regular rate and rhythm.  Abdomen is soft. Bowel sounds are positive. No hepatomegaly. No abdominal masses felt. No tenderness.  No edema to lower extremities.        Assessment:   solid food dysphagia. Web/ring needs to be ruled out.     Plan:     EGD/ED. The risks and benefits such as perforation, bleeding, and infection were reviewed with the patient and is agreeable.

## 2013-07-17 NOTE — Patient Instructions (Signed)
EGD/ED. The risks and benefits such as perforation, bleeding, and infection were reviewed with the patient and is agreeable. 

## 2013-07-18 ENCOUNTER — Encounter (HOSPITAL_COMMUNITY): Payer: Self-pay

## 2013-08-01 ENCOUNTER — Encounter (HOSPITAL_COMMUNITY)
Admission: RE | Disposition: A | Discharge status: Home / Self Care | Payer: Self-pay | Source: Ambulatory Visit | Attending: Internal Medicine

## 2013-08-01 ENCOUNTER — Encounter (HOSPITAL_COMMUNITY): Payer: Self-pay | Admitting: *Deleted

## 2013-08-01 ENCOUNTER — Ambulatory Visit (HOSPITAL_COMMUNITY)
Admission: RE | Admit: 2013-08-01 | Discharge: 2013-08-01 | Disposition: A | Discharge status: Home / Self Care | Payer: Medicare Other | Source: Ambulatory Visit | Attending: Internal Medicine | Admitting: Internal Medicine

## 2013-08-01 DIAGNOSIS — K222 Esophageal obstruction: Secondary | ICD-10-CM | POA: Insufficient documentation

## 2013-08-01 DIAGNOSIS — K2100 Gastro-esophageal reflux disease with esophagitis, without bleeding: Secondary | ICD-10-CM | POA: Insufficient documentation

## 2013-08-01 DIAGNOSIS — Z01812 Encounter for preprocedural laboratory examination: Secondary | ICD-10-CM | POA: Insufficient documentation

## 2013-08-01 DIAGNOSIS — E119 Type 2 diabetes mellitus without complications: Secondary | ICD-10-CM | POA: Insufficient documentation

## 2013-08-01 DIAGNOSIS — R1310 Dysphagia, unspecified: Secondary | ICD-10-CM

## 2013-08-01 DIAGNOSIS — K449 Diaphragmatic hernia without obstruction or gangrene: Secondary | ICD-10-CM

## 2013-08-01 DIAGNOSIS — I10 Essential (primary) hypertension: Secondary | ICD-10-CM | POA: Insufficient documentation

## 2013-08-01 DIAGNOSIS — K219 Gastro-esophageal reflux disease without esophagitis: Secondary | ICD-10-CM

## 2013-08-01 HISTORY — PX: ESOPHAGOGASTRODUODENOSCOPY (EGD) WITH ESOPHAGEAL DILATION: SHX5812

## 2013-08-01 LAB — GLUCOSE, CAPILLARY

## 2013-08-01 SURGERY — ESOPHAGOGASTRODUODENOSCOPY (EGD) WITH ESOPHAGEAL DILATION
Anesthesia: Moderate Sedation

## 2013-08-01 MED ORDER — MIDAZOLAM HCL 5 MG/5ML IJ SOLN
INTRAMUSCULAR | Status: DC | PRN
  Administered 2013-08-01 (×2): 2 mg via INTRAVENOUS
  Administered 2013-08-01: 1 mg via INTRAVENOUS
  Administered 2013-08-01: 3 mg via INTRAVENOUS
  Administered 2013-08-01: 2 mg via INTRAVENOUS

## 2013-08-01 MED ORDER — SODIUM CHLORIDE 0.9 % IV SOLN
INTRAVENOUS | Status: DC
  Administered 2013-08-01: 13:00:00 via INTRAVENOUS

## 2013-08-01 MED ORDER — MEPERIDINE HCL 50 MG/ML IJ SOLN
INTRAMUSCULAR | Status: AC
  Filled 2013-08-01: qty 1

## 2013-08-01 MED ORDER — BUTAMBEN-TETRACAINE-BENZOCAINE 2-2-14 % EX AERO
INHALATION_SPRAY | CUTANEOUS | Status: DC | PRN
  Administered 2013-08-01: 2 via TOPICAL

## 2013-08-01 MED ORDER — MIDAZOLAM HCL 5 MG/5ML IJ SOLN
INTRAMUSCULAR | Status: AC
  Filled 2013-08-01: qty 10

## 2013-08-01 MED ORDER — STERILE WATER FOR IRRIGATION IR SOLN
Status: DC | PRN
  Administered 2013-08-01: 13:00:00

## 2013-08-01 MED ORDER — MEPERIDINE HCL 25 MG/ML IJ SOLN
INTRAMUSCULAR | Status: DC | PRN
  Administered 2013-08-01 (×2): 25 mg via INTRAVENOUS

## 2013-08-01 MED ORDER — PANTOPRAZOLE SODIUM 40 MG PO TBEC: 40.0000 mg | DELAYED_RELEASE_TABLET | Freq: Two times a day (BID) | ORAL | Status: DC

## 2013-08-01 NOTE — Op Note (Signed)
EGD PROCEDURE REPORT  PATIENT:  Wendy Fuller  MR#:  [id number] Birthdate:  October 12, 1940, 72 y.o., female Endoscopist:  Dr. Malissa Hippo, MD Referred By:  Dr. Ignatius Specking, MD Procedure Date: 08/01/2013  Procedure:   EGD with ED.  Indications:  Patient is 72 year old Caucasian female who has chronic GERD who presents with intermittent solid food dysphagia. She is having intermittent heartburn in spite of taking ranitidine.            Informed Consent:  The risks, benefits, alternatives & imponderables which include, but are not limited to, bleeding, infection, perforation, drug reaction and potential missed lesion have been reviewed.  The potential for biopsy, lesion removal, esophageal dilation, etc. have also been discussed.  Questions have been answered.  All parties agreeable.  Please see history & physical in medical record for more information.  Medications:  Demerol 50 mg IV Versed 10 mg IV Cetacaine spray topically for oropharyngeal anesthesia  Description of procedure:  The endoscope was introduced through the mouth and advanced to the second portion of the duodenum without difficulty or limitations. The mucosal surfaces were surveyed very carefully during advancement of the scope and upon withdrawal.  Findings:  Esophagus:  Mucosa of the esophagus was normal. Prominent ring noted at GE junction along with erosion. GEJ:  32 cm Hiatus:  34 cm Stomach:  Stomach was empty and distended very well with insufflation. Folds in the proximal stomach are normal. Examination of the mucosa and gastric body, antrum, pyloric channel, angularis fundus and cardia was normal.  Hernia was easily seen in this view. Duodenum:  Normal bulbar and post bulbar mucosa.  Therapeutic/Diagnostic Maneuvers Performed:   Esophagus dilated by passing 56 French Maloney dilator to full insertion. Endoscope was passed again and esophageal mucosa reexamined. About 2 cm long linear mucosal disruption noted 5 cm  proximal to GE junction as well as at GE junction. Ring was further disrupted with focal biopsy at two sites. No tissue is saved  Complications:  None  Impression: Erosive reflux esophagitis with prominent Schatzki's ring. Small sliding hiatal hernia. Esophagus dilated by passing 56 French Maloney dilator resulting in mucosal disruption proximal to GE junction along with disruption of the ring. Schatzki's ring was further disrupted with focal biopsy at two sites.  Recommendations:  Discontinue ranitidine. Pantoprazole 40 mg by mouth twice a day. Progress report in one week. Office visit in 4 months at which time we'll consider dropping PPI dose to once a day.  Bonnell Placzek U  08/01/2013  1:33 PM  CC: Dr. Ignatius Specking., MD & Dr. Bonnetta Barry ref. provider found

## 2013-08-01 NOTE — H&P (Addendum)
Wendy Fuller is an 72 y.o. female.   Chief Complaint: Patient is here for EGD and ED. HPI: Patient is 72 year old Caucasian female with chronic GERD and maintain on H2B who presents with intermittent solid food dysphagia. She underwent EGD and EGD in October 2013 at [facility] without symptom relief. She was noted to have hiatal hernia and Schatzki's ring. She points to the suprasternal and upper sternal area at site of bolus obstruction. She has most difficulty with meats and cornbread. She has good appetite. She denies nausea vomiting melena or abdominal pain. She says her heartburn is not well controlled with present therapy.  Past Medical History  Diagnosis Date  . Diabetes     x 3 yrs  . Back pain   . Hypertension     Past Surgical History  Procedure Laterality Date  . Complete hyster    . Thyroid surgery    . Shoulder surgery    . Abdominal hysterectomy      History reviewed. No pertinent family history. Social History:  reports that she has never smoked. She does not have any smokeless tobacco history on file. She reports that she does not drink alcohol or use illicit drugs.  Allergies:  Allergies  Allergen Reactions  . Penicillins Hives    Medications Prior to Admission  Medication Sig Dispense Refill  . chlordiazePOXIDE-amitriptyline (LIMBITROL DS) 10-25 MG TABS Take 1 tablet by mouth 2 (two) times daily.      Marland Kitchen gabapentin (NEURONTIN) 300 MG capsule Take 300 mg by mouth 3 (three) times daily.        Marland Kitchen glimepiride (AMARYL) 4 MG tablet Take 4 mg by mouth daily before breakfast.        . lisinopril-hydrochlorothiazide (PRINZIDE,ZESTORETIC) 10-12.5 MG per tablet Take 1 tablet by mouth daily.        . metFORMIN (GLUCOPHAGE) 500 MG tablet Take 500 mg by mouth 2 (two) times daily with a meal.       . methocarbamol (ROBAXIN) 500 MG tablet Take 500 mg by mouth 3 (three) times daily.        Marland Kitchen oxymorphone (OPANA) 5 MG tablet Take 5 mg by mouth as needed for pain.      . ranitidine  (ZANTAC) 300 MG capsule Take 300 mg by mouth daily as needed for heartburn.       . fentaNYL (DURAGESIC - DOSED MCG/HR) 25 MCG/HR patch Place 25 mcg onto the skin every 3 (three) days.        No results found for this or any previous visit (from the past 48 hour(s)). No results found.  ROS  Blood pressure 150/82, pulse 85, temperature 97.7 F (36.5 C), temperature source Oral, resp. rate 16, height 5\' 3"  (1.6 m), weight 168 lb (76.204 kg), SpO2 95.00%. Physical Exam  Constitutional: She appears well-developed and well-nourished.  HENT:  Mouth/Throat: Oropharynx is clear and moist.  Eyes: Conjunctivae are normal. No scleral icterus.  Neck: No thyromegaly present.  GI: Soft. She exhibits no distension and no mass. Tenderness: mild generalized tenderness. There is no rebound.  Musculoskeletal: She exhibits no edema.  Lymphadenopathy:    She has no cervical adenopathy.  Neurological: She is alert.  Skin: Skin is warm and dry.     Assessment/Plan Solid food dysphagia. Chronic GERD. EGD and ED.  Tiphany Fayson U 08/01/2013, 1:01 PM

## 2013-08-06 ENCOUNTER — Encounter (HOSPITAL_COMMUNITY): Payer: Self-pay | Admitting: Internal Medicine

## 2013-11-21 ENCOUNTER — Encounter (INDEPENDENT_AMBULATORY_CARE_PROVIDER_SITE_OTHER): Payer: Self-pay | Admitting: *Deleted

## 2013-12-11 ENCOUNTER — Ambulatory Visit (INDEPENDENT_AMBULATORY_CARE_PROVIDER_SITE_OTHER): Payer: Medicare Other | Admitting: Internal Medicine

## 2013-12-11 ENCOUNTER — Other Ambulatory Visit (INDEPENDENT_AMBULATORY_CARE_PROVIDER_SITE_OTHER): Payer: Self-pay | Admitting: *Deleted

## 2013-12-11 ENCOUNTER — Encounter (INDEPENDENT_AMBULATORY_CARE_PROVIDER_SITE_OTHER): Payer: Self-pay | Admitting: Internal Medicine

## 2013-12-11 ENCOUNTER — Encounter (INDEPENDENT_AMBULATORY_CARE_PROVIDER_SITE_OTHER): Payer: Self-pay | Admitting: *Deleted

## 2013-12-11 VITALS — BP 108/64 | HR 72 | Temp 97.6°F | Ht 63.0 in | Wt 169.0 lb

## 2013-12-11 DIAGNOSIS — R1310 Dysphagia, unspecified: Secondary | ICD-10-CM

## 2013-12-11 NOTE — Patient Instructions (Signed)
EGD/ED. The risks and benefits such as perforation, bleeding, and infection were reviewed with the patient and is agreeable. 

## 2013-12-11 NOTE — Progress Notes (Signed)
Subjective:     Patient ID: Wendy Fuller, female   DOB: [birth year], 73 y.o.   MRN: [id number]  HPIHere today for f/u. Underwent an EGD/ED in December (see below) for solid foods dysphagia. Placed on PPI BID for erosive reflux esophagitis.  Today she tells me she is having dysphagia. It is occurring 3-4 times a weeks. Meats especially will lodge. She says foods feel like they are lodging in her upper esophagus. She said she was better x 4 weeks after her last EGD/ED in December of 2014. Appetite is good. No weight loss.  Acid reflux is controlled with Protonix. Usually has a BM daily. 08/01/2013 EGD/ED Dr. Laural Golden, dysphagia: Impression:  Erosive reflux esophagitis with prominent Schatzki's ring.   Small sliding hiatal hernia.  Esophagus dilated by passing 56 French Maloney dilator resulting in mucosal disruption proximal to GE junction along with disruption of the ring. Schatzki's ring was further disrupted with focal biopsy at two sites.    Review of Systems Past Medical History  Diagnosis Date  . Diabetes     x 3 yrs  . Back pain   . Hypertension     Past Surgical History  Procedure Laterality Date  . Complete hyster    . Thyroid surgery    . Shoulder surgery    . Abdominal hysterectomy    . Esophagogastroduodenoscopy (egd) with esophageal dilation N/A 08/01/2013    Procedure: ESOPHAGOGASTRODUODENOSCOPY (EGD) WITH ESOPHAGEAL DILATION;  Surgeon: Rogene Houston, MD;  Location: [facility];  Service: Endoscopy;  Laterality: N/A;  345-moved to 100 [place] notified pt    Allergies  Allergen Reactions  . Penicillins Hives    Current Outpatient Prescriptions on File Prior to Visit  Medication Sig Dispense Refill  . chlordiazePOXIDE-amitriptyline (LIMBITROL DS) 10-25 MG TABS Take 1 tablet by mouth 2 (two) times daily.      . fentaNYL (DURAGESIC - DOSED MCG/HR) 25 MCG/HR patch Place 25 mcg onto the skin every 3 (three) days.      Marland Kitchen gabapentin (NEURONTIN) 300 MG capsule Take 300 mg  by mouth 3 (three) times daily.        Marland Kitchen glimepiride (AMARYL) 4 MG tablet Take 4 mg by mouth daily before breakfast.        . lisinopril-hydrochlorothiazide (PRINZIDE,ZESTORETIC) 10-12.5 MG per tablet Take 1 tablet by mouth daily.        . metFORMIN (GLUCOPHAGE) 500 MG tablet Take 500 mg by mouth 2 (two) times daily with a meal.       . methocarbamol (ROBAXIN) 500 MG tablet Take 500 mg by mouth 3 (three) times daily.        Marland Kitchen oxymorphone (OPANA) 5 MG tablet Take 5 mg by mouth as needed for pain.      . pantoprazole (PROTONIX) 40 MG tablet Take 1 tablet (40 mg total) by mouth 2 (two) times daily.  60 tablet  3   No current facility-administered medications on file prior to visit.   Married, Retired from [employer] ([employer]). Four children in good health.      Objective:   Physical Exam  Filed Vitals:   12/11/13 0927  BP: 108/64  Pulse: 72  Temp: 97.6 F (36.4 C)  Height: 5\' 3"  (1.6 m)  Weight: 169 lb (76.658 kg)   Alert and oriented. Skin warm and dry. Oral mucosa is moist.   . Sclera anicteric, conjunctivae is pink. Thyroid not enlarged. No cervical lymphadenopathy. Lungs clear. Heart regular rate and rhythm.  Abdomen  is soft. Bowel sounds are positive. No hepatomegaly. No abdominal masses felt. No tenderness.  No edema to lower extremities.       Assessment:     Solid foods dysphagia.  Discussed with Dr. Laural Golden.  Last EGD revealed a prominent Schatzki's ring.         Plan:    EGD/ED. The risks and benefits such as perforation, bleeding, and infection were reviewed with the patient and is agreeable.

## 2013-12-13 ENCOUNTER — Encounter (HOSPITAL_COMMUNITY): Payer: Self-pay | Admitting: Pharmacy Technician

## 2013-12-21 ENCOUNTER — Ambulatory Visit (HOSPITAL_COMMUNITY)
Admission: RE | Admit: 2013-12-21 | Discharge: 2013-12-21 | Disposition: A | Discharge status: Home / Self Care | Payer: Medicare Other | Source: Ambulatory Visit | Attending: Internal Medicine | Admitting: Internal Medicine

## 2013-12-21 ENCOUNTER — Encounter (HOSPITAL_COMMUNITY)
Admission: RE | Disposition: A | Discharge status: Home / Self Care | Payer: Self-pay | Source: Ambulatory Visit | Attending: Internal Medicine

## 2013-12-21 DIAGNOSIS — D131 Benign neoplasm of stomach: Secondary | ICD-10-CM | POA: Insufficient documentation

## 2013-12-21 DIAGNOSIS — Q393 Congenital stenosis and stricture of esophagus: Secondary | ICD-10-CM

## 2013-12-21 DIAGNOSIS — K219 Gastro-esophageal reflux disease without esophagitis: Secondary | ICD-10-CM | POA: Insufficient documentation

## 2013-12-21 DIAGNOSIS — E119 Type 2 diabetes mellitus without complications: Secondary | ICD-10-CM | POA: Insufficient documentation

## 2013-12-21 DIAGNOSIS — Z79899 Other long term (current) drug therapy: Secondary | ICD-10-CM | POA: Insufficient documentation

## 2013-12-21 DIAGNOSIS — R1310 Dysphagia, unspecified: Secondary | ICD-10-CM

## 2013-12-21 DIAGNOSIS — K449 Diaphragmatic hernia without obstruction or gangrene: Secondary | ICD-10-CM | POA: Insufficient documentation

## 2013-12-21 DIAGNOSIS — Q391 Atresia of esophagus with tracheo-esophageal fistula: Secondary | ICD-10-CM | POA: Insufficient documentation

## 2013-12-21 DIAGNOSIS — I10 Essential (primary) hypertension: Secondary | ICD-10-CM | POA: Insufficient documentation

## 2013-12-21 HISTORY — PX: ESOPHAGOGASTRODUODENOSCOPY: SHX5428

## 2013-12-21 HISTORY — PX: SAVORY DILATION: SHX5439

## 2013-12-21 HISTORY — PX: MALONEY DILATION: SHX5535

## 2013-12-21 HISTORY — PX: BALLOON DILATION: SHX5330

## 2013-12-21 LAB — GLUCOSE, CAPILLARY: Glucose-Capillary: 119 mg/dL — ABNORMAL HIGH (ref 70–99)

## 2013-12-21 SURGERY — EGD (ESOPHAGOGASTRODUODENOSCOPY)
Anesthesia: Moderate Sedation

## 2013-12-21 MED ORDER — SODIUM CHLORIDE 0.9 % IV SOLN
INTRAVENOUS | Status: DC
  Administered 2013-12-21: 1000 mL via INTRAVENOUS

## 2013-12-21 MED ORDER — MEPERIDINE HCL 50 MG/ML IJ SOLN
INTRAMUSCULAR | Status: AC
  Filled 2013-12-21: qty 1

## 2013-12-21 MED ORDER — MIDAZOLAM HCL 5 MG/5ML IJ SOLN
INTRAMUSCULAR | Status: AC
  Filled 2013-12-21: qty 10

## 2013-12-21 MED ORDER — BUTAMBEN-TETRACAINE-BENZOCAINE 2-2-14 % EX AERO
INHALATION_SPRAY | CUTANEOUS | Status: DC | PRN
  Administered 2013-12-21: 2 via TOPICAL

## 2013-12-21 MED ORDER — STERILE WATER FOR IRRIGATION IR SOLN
Status: DC | PRN
  Administered 2013-12-21: 08:00:00

## 2013-12-21 MED ORDER — MIDAZOLAM HCL 5 MG/5ML IJ SOLN
INTRAMUSCULAR | Status: DC | PRN
  Administered 2013-12-21: 2 mg via INTRAVENOUS
  Administered 2013-12-21: 3 mg via INTRAVENOUS
  Administered 2013-12-21 (×2): 2 mg via INTRAVENOUS
  Administered 2013-12-21: 1 mg via INTRAVENOUS

## 2013-12-21 MED ORDER — MEPERIDINE HCL 50 MG/ML IJ SOLN
INTRAMUSCULAR | Status: DC | PRN
  Administered 2013-12-21 (×2): 25 mg via INTRAVENOUS

## 2013-12-21 NOTE — H&P (Signed)
Wendy Fuller is an 73 y.o. female.   Chief Complaint: Patient's here for EGD and ED. HPI: Patient is 73 year old Caucasian female who has chronic GERD maintained on double dose PPI with satisfactory control of hot who presents with recurrent dysphagia for solids. She had EGD with EGD in December 2014 when she was found to have prominent Schatzki's ring. Her esophagus was dilated by passing a 56 [place] Maloney dilator. Patient reports improvement in dysphagia lost it for a month or so. She has good appetite she denies weight loss. She also denies melena.  Past Medical History  Diagnosis Date  . Diabetes     x 3 yrs  . Back pain   . Hypertension     Past Surgical History  Procedure Laterality Date  . Complete hyster    . Thyroid surgery    . Shoulder surgery    . Abdominal hysterectomy    . Esophagogastroduodenoscopy (egd) with esophageal dilation N/A 08/01/2013    Procedure: ESOPHAGOGASTRODUODENOSCOPY (EGD) WITH ESOPHAGEAL DILATION;  Surgeon: Rogene Houston, MD;  Location: [facility];  Service: Endoscopy;  Laterality: N/A;  345-moved to 100 [place] notified pt    No family history on file. Social History:  reports that she has never smoked. She does not have any smokeless tobacco history on file. She reports that she does not drink alcohol or use illicit drugs.  Allergies:  Allergies  Allergen Reactions  . Penicillins Hives    Medications Prior to Admission  Medication Sig Dispense Refill  . aspirin EC 81 MG tablet Take 81 mg by mouth daily.      . chlordiazePOXIDE-amitriptyline (LIMBITROL DS) 10-25 MG TABS Take 1 tablet by mouth 2 (two) times daily.      . fentaNYL (DURAGESIC - DOSED MCG/HR) 25 MCG/HR patch Place 25 mcg onto the skin every 3 (three) days.      Marland Kitchen gabapentin (NEURONTIN) 300 MG capsule Take 300 mg by mouth 3 (three) times daily.        Marland Kitchen glimepiride (AMARYL) 4 MG tablet Take 4 mg by mouth daily before breakfast.        . lisinopril-hydrochlorothiazide  (PRINZIDE,ZESTORETIC) 10-12.5 MG per tablet Take 1 tablet by mouth daily.        . metFORMIN (GLUCOPHAGE) 500 MG tablet Take 500 mg by mouth 2 (two) times daily with a meal.       . methocarbamol (ROBAXIN) 500 MG tablet Take 500 mg by mouth 3 (three) times daily.        Marland Kitchen oxymorphone (OPANA) 5 MG tablet Take 5 mg by mouth every 6 (six) hours as needed for pain.       . pantoprazole (PROTONIX) 40 MG tablet Take 1 tablet (40 mg total) by mouth 2 (two) times daily.  60 tablet  3    Results for orders placed during the hospital encounter of 12/21/13 (from the past 48 hour(s))  GLUCOSE, CAPILLARY     Status: Abnormal   Collection Time    12/21/13  7:13 AM      Result Value Ref Range   Glucose-Capillary 119 (*) 70 - 99 mg/dL   No results found.  ROS  Blood pressure 132/66, pulse 87, temperature 97.5 F (36.4 C), temperature source Oral, resp. rate 20, SpO2 97.00%. Physical Exam  Constitutional: She appears well-developed and well-nourished.  HENT:  Mouth/Throat: Oropharynx is clear and moist.  She has upper and lower dentures.  Eyes: Conjunctivae are normal. No scleral icterus.  Neck: No  thyromegaly present.  Cardiovascular: Normal rate, regular rhythm and normal heart sounds.   No murmur heard. Respiratory: Effort normal and breath sounds normal.  GI: Soft. She exhibits no distension and no mass. There is no tenderness.  Musculoskeletal: She exhibits no edema.  Lymphadenopathy:    She has no cervical adenopathy.  Neurological: She is alert.  Skin: Skin is warm and dry.     Assessment/Plan Solid food dysphagia. Chronic GERD. EGD with ED.  [name] U [name] 12/21/2013, 7:32 AM

## 2013-12-21 NOTE — Discharge Instructions (Signed)
Resume usual medications as before except aspirin which can be started on 12/23/2013. Soft diet for 24 hours No driving for 24 hours. Physician will call with biopsy results  Esophagogastroduodenoscopy Care After Refer to this sheet in the next few weeks. These instructions provide you with information on caring for yourself after your procedure. Your caregiver may also give you more specific instructions. Your treatment has been planned according to current medical practices, but problems sometimes occur. Call your caregiver if you have any problems or questions after your procedure.  HOME CARE INSTRUCTIONS  Do not eat or drink anything until the numbing medicine (local anesthetic) has worn off and your gag reflex has returned. You will know that the local anesthetic has worn off when you can swallow comfortably.  Do not drive for 12 hours after the procedure or as directed by your caregiver.  Only take medicines as directed by your caregiver. SEEK MEDICAL CARE IF:   You cannot stop coughing.  You are not urinating at all or less than usual. SEEK IMMEDIATE MEDICAL CARE IF:  You have difficulty swallowing.  You cannot eat or drink.  You have worsening throat or chest pain.  You have dizziness, lightheadedness, or you faint.  You have nausea or vomiting.  You have chills.  You have a fever.  You have severe abdominal pain.  You have black, tarry, or bloody stools. Document Released: 07/26/2012 Document Reviewed: 07/26/2012 [facility] Patient Information 2014 [place], [place]. Soft Diet The soft diet may be recommended after you were put on a full liquid diet. A normal diet may follow. The soft diet can also be used after surgery if you are too ill to keep down a normal diet. The soft diet may also be needed if you have a hard time chewing foods. DESCRIPTION Tender foods are used. Foods do not need to be ground or pureed. Most raw fruits and vegetables and coarse breads and  cereals should be avoided. Fried foods and highly seasoned foods may cause discomfort. NUTRITIONAL ADEQUACY A healthy diet is possible if foods from each of the basic food groups are eaten daily. SOFT DIET FOOD LISTS Milk/Dairy  Allowed: Milk and milk drinks, milk shakes, cream cheese, cottage cheese, mild cheeses.  Avoid: Sharp or highly seasoned cheese. Meat/Meat Substitutes  Allowed: Broiled, roasted, baked, or stewed tender lean beef, mutton, lamb, veal, chicken, [place], liver, ham, crisp bacon, white fish, tuna, salmon. Eggs, smooth peanut butter.  Avoid: All fried meats, fish, or fowl. Rich gravies and sauces. Lunch meats, sausages, hot dogs. Meats with gristle, chunky peanut butter. Breads/Grains  Allowed: Rice, noodles, spaghetti, macaroni. Dry or cooked refined cereals, such as farina, cream of wheat, oatmeal, grits, whole-wheat cereals. Plain or toasted white or wheat blend or whole-grain breads, soda crackers or saltines, flour tortillas.  Avoid: Wild rice, coarse cereals, such as bran. Seed in or on breads and crackers. Bread or bread products with nuts or seeds. Fruits/Vegetables  Allowed: Fruit and vegetable juices, well-cooked or canned fruits and vegetables, any dried fruit. One citrus fruit daily, 1 vitamin A source daily. Well-ripened, easy to chew fruits, sweet potatoes. Baked, boiled, mashed, creamed, scalloped, or au gratin potatoes. Broths or creamed soups made with allowed vegetables, strained tomatoes.  Avoid: All gas-forming vegetables (corn, radishes, Brussels sprouts, onions, broccoli, cabbage, parsnips, turnips, chili peppers, pinto beans, split peas, dried beans). Fruits containing seeds and skin. Potato chips and corn chips. All others that are not made with allowed vegetables. Highly seasoned soups. Desserts/Sweets  Allowed: Simple desserts, such as custard, junkets, gelatin desserts, plain ice cream and sherbets, simple cakes and cookies, allowed fruits,  sugar, syrup, jelly, honey, plain hard candy, and molasses.  Avoid: Rich pastries, any dessert containing dates, nuts, raisins, or coconut. Fried pastries, such as doughnuts. Chocolate. Beverages  Allowed: Fruit and vegetable juices. Caffeine-free carbonated drinks, coffee, and tea.  Avoid: Caffeinated beverages: coffee, tea, soda or pop. Miscellaneous  Allowed: Butter, cream, margarine, mayonnaise, oil. Cream sauces, salt, and mild spices.  Avoid: Highly spiced salad dressings. Highly seasoned foods, hot sauce, mustard, horseradish, and pepper. SAMPLE MENU Breakfast  Orange juice.  Oatmeal.  Soft cooked egg.  Toast and margarine.  2% milk.  Coffee. Lunch  Meatloaf.  Mashed potato.  Green beans.  Lemon pudding.  Bread and margarine.  Coffee. Dinner  Consomm or apricot nectar.  Chicken breast.  Rice, peas, and carrots.  Applesauce.  Bread and margarine.  2% milk. To cut the amount of fat in your diet, omit margarine and use 1% or skim milk. NUTRIENT ANALYSIS  Calories........................1953 Kcal.  Protein.........................102 gm.  Carbohydrate...............247 gm.  Fat................................65 gm.  Cholesterol...................449 mg.  Dietary fiber.................19 gm.  Vitamin A.....................2944 RE.  Vitamin C.....................79 mg.  Niacin..........................25 mg.  Riboflavin....................2.0 mg.  Thiamin.......................1.5 mg.  Folate..........................249 mcg.  Calcium.......................1030 mg.  Phosphorus.................1782 mg.  Zinc..............................12 mg.  Iron..............................13 mg.  Sodium.........................299 mg.  Potassium....................3046 mg. Document Released: 11/16/2007 Document Revised: 11/01/2011 Document Reviewed: 11/16/2007 [facility] Patient Information 2014 [place], [place].

## 2013-12-21 NOTE — Op Note (Signed)
EGD PROCEDURE REPORT  PATIENT:  Wendy Fuller  MR#:  [id number] Birthdate:  26-Feb-1941, 73 y.o., female Endoscopist:  Dr. Rogene Houston, MD Referred By:  Dr. Glenda Chroman, MD Procedure Date: 12/21/2013  Procedure:   EGD with ED.  Indications:  Patient is an 54-year-old Caucasian female who has chronic GERD and history of Schatzki's ring. Harkens well controlled with double dose PPI. Esophagus was dilated and then was instructed in December 2014. She returns with solid food dysphagia. She's having symptoms 2-3 times a week week and she also had one episode of food impaction relieved with regurgitation.            Informed Consent:  The risks, benefits, alternatives & imponderables which include, but are not limited to, bleeding, infection, perforation, drug reaction and potential missed lesion have been reviewed.  The potential for biopsy, lesion removal, esophageal dilation, etc. have also been discussed.  Questions have been answered.  All parties agreeable.  Please see history & physical in medical record for more information.  Medications:  Demerol 50 mg IV Versed 10 mg IV Cetacaine spray topically for oropharyngeal anesthesia  Description of procedure:  The endoscope was introduced through the mouth and advanced to the second portion of the duodenum without difficulty or limitations. The mucosal surfaces were surveyed very carefully during advancement of the scope and upon withdrawal.  Findings:  Esophagus:  Mucosa of the esophagus appeared to be unremarkable. Schatzki's ring noted at GE junction but not as pronounced as on last exam. GEJ:  30 cm Hiatus:  33 cm Stomach:  Stomach was empty and distended very well with insufflation. Folds in the proximal stomach were normal. Examination of mucosa revealed two small hyperplastic-appearing polyps at gastric body and these were left alone. Antral mucosa was normal. Pyloric channel was patent. Angularis, fundus and cardia were  unremarkable. Duodenum:  Normal bulbar and post bulbar mucosa.  Therapeutic/Diagnostic Maneuvers Performed:   Esophagus was dilated by passing 56 [place] Maloney dilator to full insertion. Endoscope was passed again. There was mucosal disruption at GE junction implying disruption of Schatzki's ring. Ring was further disrupted by taking focal biopsy at two sites. Biopsy was also taken from body of esophagus to rule out eosinophilic esophagitis.  Complications:  None  Impression: Schatzki's ring and a small sliding hiatal hernia. Ring disrupted by passing 7 French Maloney dilator and focal biopsy. Two small hyperplastic appearing polyps at gastric body. Esophageal biopsy taken to rule out eosinophilic esophagitis.   Recommendations:  Standard instructions given. I will be contacting patient with biopsy results.  Rogene Houston  12/21/2013  8:04 AM  CC: Dr. Glenda Chroman., MD & Dr. Rayne Du ref. provider found

## 2013-12-25 ENCOUNTER — Encounter (HOSPITAL_COMMUNITY): Payer: Self-pay | Admitting: Internal Medicine

## 2014-01-07 ENCOUNTER — Other Ambulatory Visit (INDEPENDENT_AMBULATORY_CARE_PROVIDER_SITE_OTHER): Payer: Self-pay | Admitting: Internal Medicine

## 2014-01-07 DIAGNOSIS — R1310 Dysphagia, unspecified: Secondary | ICD-10-CM

## 2014-01-07 DIAGNOSIS — K222 Esophageal obstruction: Secondary | ICD-10-CM

## 2014-01-08 ENCOUNTER — Encounter (INDEPENDENT_AMBULATORY_CARE_PROVIDER_SITE_OTHER): Payer: Self-pay | Admitting: *Deleted

## 2014-01-10 ENCOUNTER — Ambulatory Visit (HOSPITAL_COMMUNITY)
Admission: RE | Admit: 2014-01-10 | Discharge: 2014-01-10 | Disposition: A | Discharge status: Home / Self Care | Payer: Medicare Other | Source: Ambulatory Visit | Attending: Internal Medicine | Admitting: Internal Medicine

## 2014-01-10 DIAGNOSIS — K449 Diaphragmatic hernia without obstruction or gangrene: Secondary | ICD-10-CM | POA: Insufficient documentation

## 2014-01-10 DIAGNOSIS — K219 Gastro-esophageal reflux disease without esophagitis: Secondary | ICD-10-CM | POA: Insufficient documentation

## 2014-01-10 DIAGNOSIS — R1310 Dysphagia, unspecified: Secondary | ICD-10-CM

## 2014-01-10 DIAGNOSIS — K222 Esophageal obstruction: Secondary | ICD-10-CM

## 2014-06-07 ENCOUNTER — Ambulatory Visit (INDEPENDENT_AMBULATORY_CARE_PROVIDER_SITE_OTHER): Payer: Medicare Other | Admitting: Urology

## 2014-06-07 DIAGNOSIS — R300 Dysuria: Secondary | ICD-10-CM

## 2014-06-07 DIAGNOSIS — N3000 Acute cystitis without hematuria: Secondary | ICD-10-CM

## 2014-08-09 ENCOUNTER — Ambulatory Visit (INDEPENDENT_AMBULATORY_CARE_PROVIDER_SITE_OTHER): Payer: Medicare Other | Admitting: Urology

## 2014-08-09 DIAGNOSIS — N3000 Acute cystitis without hematuria: Secondary | ICD-10-CM

## 2014-08-09 DIAGNOSIS — N952 Postmenopausal atrophic vaginitis: Secondary | ICD-10-CM

## 2014-08-09 DIAGNOSIS — R300 Dysuria: Secondary | ICD-10-CM

## 2014-08-19 ENCOUNTER — Encounter (INDEPENDENT_AMBULATORY_CARE_PROVIDER_SITE_OTHER): Payer: Self-pay

## 2014-10-18 ENCOUNTER — Ambulatory Visit (INDEPENDENT_AMBULATORY_CARE_PROVIDER_SITE_OTHER): Payer: Medicare Other | Admitting: Urology

## 2014-10-18 DIAGNOSIS — N952 Postmenopausal atrophic vaginitis: Secondary | ICD-10-CM | POA: Diagnosis not present

## 2014-10-18 DIAGNOSIS — R300 Dysuria: Secondary | ICD-10-CM

## 2014-10-18 DIAGNOSIS — N3941 Urge incontinence: Secondary | ICD-10-CM | POA: Diagnosis not present

## 2015-01-02 ENCOUNTER — Ambulatory Visit (INDEPENDENT_AMBULATORY_CARE_PROVIDER_SITE_OTHER): Payer: Medicare Other | Admitting: Otolaryngology

## 2015-01-02 DIAGNOSIS — H6121 Impacted cerumen, right ear: Secondary | ICD-10-CM | POA: Diagnosis not present

## 2015-01-02 DIAGNOSIS — H9011 Conductive hearing loss, unilateral, right ear, with unrestricted hearing on the contralateral side: Secondary | ICD-10-CM

## 2015-05-02 ENCOUNTER — Ambulatory Visit (INDEPENDENT_AMBULATORY_CARE_PROVIDER_SITE_OTHER): Payer: Medicare Other | Admitting: Urology

## 2015-05-02 DIAGNOSIS — R339 Retention of urine, unspecified: Secondary | ICD-10-CM

## 2015-05-02 DIAGNOSIS — N952 Postmenopausal atrophic vaginitis: Secondary | ICD-10-CM | POA: Diagnosis not present

## 2015-05-02 DIAGNOSIS — N3000 Acute cystitis without hematuria: Secondary | ICD-10-CM | POA: Diagnosis not present

## 2015-08-15 ENCOUNTER — Ambulatory Visit: Payer: BC Managed Care – PPO | Admitting: Urology

## 2015-08-27 DIAGNOSIS — M545 Low back pain: Secondary | ICD-10-CM | POA: Diagnosis not present

## 2015-08-27 DIAGNOSIS — M5416 Radiculopathy, lumbar region: Secondary | ICD-10-CM | POA: Diagnosis not present

## 2015-09-18 DIAGNOSIS — R110 Nausea: Secondary | ICD-10-CM | POA: Diagnosis not present

## 2015-09-18 DIAGNOSIS — E1165 Type 2 diabetes mellitus with hyperglycemia: Secondary | ICD-10-CM | POA: Diagnosis not present

## 2015-09-18 DIAGNOSIS — F419 Anxiety disorder, unspecified: Secondary | ICD-10-CM | POA: Diagnosis not present

## 2015-09-18 DIAGNOSIS — I10 Essential (primary) hypertension: Secondary | ICD-10-CM | POA: Diagnosis not present

## 2015-12-02 DIAGNOSIS — M545 Low back pain: Secondary | ICD-10-CM | POA: Diagnosis not present

## 2015-12-02 DIAGNOSIS — Z6829 Body mass index (BMI) 29.0-29.9, adult: Secondary | ICD-10-CM | POA: Diagnosis not present

## 2015-12-02 DIAGNOSIS — M5416 Radiculopathy, lumbar region: Secondary | ICD-10-CM | POA: Diagnosis not present

## 2015-12-23 DIAGNOSIS — F419 Anxiety disorder, unspecified: Secondary | ICD-10-CM | POA: Diagnosis not present

## 2015-12-23 DIAGNOSIS — I10 Essential (primary) hypertension: Secondary | ICD-10-CM | POA: Diagnosis not present

## 2015-12-23 DIAGNOSIS — E1165 Type 2 diabetes mellitus with hyperglycemia: Secondary | ICD-10-CM | POA: Diagnosis not present

## 2015-12-23 DIAGNOSIS — Z87891 Personal history of nicotine dependence: Secondary | ICD-10-CM | POA: Diagnosis not present

## 2015-12-25 ENCOUNTER — Ambulatory Visit (INDEPENDENT_AMBULATORY_CARE_PROVIDER_SITE_OTHER): Payer: BC Managed Care – PPO | Admitting: Otolaryngology

## 2016-02-26 DIAGNOSIS — M5416 Radiculopathy, lumbar region: Secondary | ICD-10-CM | POA: Diagnosis not present

## 2016-02-26 DIAGNOSIS — M545 Low back pain: Secondary | ICD-10-CM | POA: Diagnosis not present

## 2016-04-06 DIAGNOSIS — Z299 Encounter for prophylactic measures, unspecified: Secondary | ICD-10-CM | POA: Diagnosis not present

## 2016-04-06 DIAGNOSIS — R5383 Other fatigue: Secondary | ICD-10-CM | POA: Diagnosis not present

## 2016-04-06 DIAGNOSIS — N390 Urinary tract infection, site not specified: Secondary | ICD-10-CM | POA: Diagnosis not present

## 2016-04-06 DIAGNOSIS — E7800 Pure hypercholesterolemia, unspecified: Secondary | ICD-10-CM | POA: Diagnosis not present

## 2016-04-06 DIAGNOSIS — E1165 Type 2 diabetes mellitus with hyperglycemia: Secondary | ICD-10-CM | POA: Diagnosis not present

## 2016-04-06 DIAGNOSIS — I10 Essential (primary) hypertension: Secondary | ICD-10-CM | POA: Diagnosis not present

## 2016-04-06 DIAGNOSIS — Z0000 Encounter for general adult medical examination without abnormal findings: Secondary | ICD-10-CM | POA: Diagnosis not present

## 2016-04-06 DIAGNOSIS — Z1389 Encounter for screening for other disorder: Secondary | ICD-10-CM | POA: Diagnosis not present

## 2016-04-06 DIAGNOSIS — Z1211 Encounter for screening for malignant neoplasm of colon: Secondary | ICD-10-CM | POA: Diagnosis not present

## 2016-04-06 DIAGNOSIS — Z79899 Other long term (current) drug therapy: Secondary | ICD-10-CM | POA: Diagnosis not present

## 2016-04-06 DIAGNOSIS — Z7189 Other specified counseling: Secondary | ICD-10-CM | POA: Diagnosis not present

## 2016-04-06 DIAGNOSIS — F419 Anxiety disorder, unspecified: Secondary | ICD-10-CM | POA: Diagnosis not present

## 2016-05-24 DIAGNOSIS — M545 Low back pain: Secondary | ICD-10-CM | POA: Diagnosis not present

## 2016-05-24 DIAGNOSIS — M5416 Radiculopathy, lumbar region: Secondary | ICD-10-CM | POA: Diagnosis not present

## 2016-07-23 DIAGNOSIS — E1165 Type 2 diabetes mellitus with hyperglycemia: Secondary | ICD-10-CM | POA: Diagnosis not present

## 2016-07-23 DIAGNOSIS — I10 Essential (primary) hypertension: Secondary | ICD-10-CM | POA: Diagnosis not present

## 2016-07-23 DIAGNOSIS — F419 Anxiety disorder, unspecified: Secondary | ICD-10-CM | POA: Diagnosis not present

## 2016-07-23 DIAGNOSIS — Z299 Encounter for prophylactic measures, unspecified: Secondary | ICD-10-CM | POA: Diagnosis not present

## 2016-07-23 DIAGNOSIS — Z6831 Body mass index (BMI) 31.0-31.9, adult: Secondary | ICD-10-CM | POA: Diagnosis not present

## 2017-10-17 DIAGNOSIS — I10 Essential (primary) hypertension: Secondary | ICD-10-CM | POA: Diagnosis not present

## 2017-10-17 DIAGNOSIS — M545 Low back pain: Secondary | ICD-10-CM | POA: Diagnosis not present

## 2017-10-17 DIAGNOSIS — Z6830 Body mass index (BMI) 30.0-30.9, adult: Secondary | ICD-10-CM | POA: Diagnosis not present

## 2017-10-17 DIAGNOSIS — M5416 Radiculopathy, lumbar region: Secondary | ICD-10-CM | POA: Diagnosis not present

## 2017-11-09 DIAGNOSIS — E1165 Type 2 diabetes mellitus with hyperglycemia: Secondary | ICD-10-CM | POA: Diagnosis not present

## 2017-11-09 DIAGNOSIS — Z6823 Body mass index (BMI) 23.0-23.9, adult: Secondary | ICD-10-CM | POA: Diagnosis not present

## 2017-11-09 DIAGNOSIS — I10 Essential (primary) hypertension: Secondary | ICD-10-CM | POA: Diagnosis not present

## 2017-11-09 DIAGNOSIS — Z299 Encounter for prophylactic measures, unspecified: Secondary | ICD-10-CM | POA: Diagnosis not present

## 2017-11-09 DIAGNOSIS — G43909 Migraine, unspecified, not intractable, without status migrainosus: Secondary | ICD-10-CM | POA: Diagnosis not present

## 2017-11-09 DIAGNOSIS — F419 Anxiety disorder, unspecified: Secondary | ICD-10-CM | POA: Diagnosis not present

## 2017-11-14 DIAGNOSIS — Z713 Dietary counseling and surveillance: Secondary | ICD-10-CM | POA: Diagnosis not present

## 2017-11-14 DIAGNOSIS — Z299 Encounter for prophylactic measures, unspecified: Secondary | ICD-10-CM | POA: Diagnosis not present

## 2017-11-14 DIAGNOSIS — R600 Localized edema: Secondary | ICD-10-CM | POA: Diagnosis not present

## 2017-11-14 DIAGNOSIS — M1712 Unilateral primary osteoarthritis, left knee: Secondary | ICD-10-CM | POA: Diagnosis not present

## 2017-11-14 DIAGNOSIS — S8992XA Unspecified injury of left lower leg, initial encounter: Secondary | ICD-10-CM | POA: Diagnosis not present

## 2017-11-14 DIAGNOSIS — M25562 Pain in left knee: Secondary | ICD-10-CM | POA: Diagnosis not present

## 2017-11-14 DIAGNOSIS — Z6824 Body mass index (BMI) 24.0-24.9, adult: Secondary | ICD-10-CM | POA: Diagnosis not present

## 2017-12-05 DIAGNOSIS — Z6824 Body mass index (BMI) 24.0-24.9, adult: Secondary | ICD-10-CM | POA: Diagnosis not present

## 2017-12-05 DIAGNOSIS — I10 Essential (primary) hypertension: Secondary | ICD-10-CM | POA: Diagnosis not present

## 2017-12-05 DIAGNOSIS — M1710 Unilateral primary osteoarthritis, unspecified knee: Secondary | ICD-10-CM | POA: Diagnosis not present

## 2017-12-05 DIAGNOSIS — Z1331 Encounter for screening for depression: Secondary | ICD-10-CM | POA: Diagnosis not present

## 2017-12-05 DIAGNOSIS — Z1211 Encounter for screening for malignant neoplasm of colon: Secondary | ICD-10-CM | POA: Diagnosis not present

## 2017-12-05 DIAGNOSIS — Z299 Encounter for prophylactic measures, unspecified: Secondary | ICD-10-CM | POA: Diagnosis not present

## 2017-12-05 DIAGNOSIS — E1165 Type 2 diabetes mellitus with hyperglycemia: Secondary | ICD-10-CM | POA: Diagnosis not present

## 2017-12-05 DIAGNOSIS — Z0000 Encounter for general adult medical examination without abnormal findings: Secondary | ICD-10-CM | POA: Diagnosis not present

## 2017-12-05 DIAGNOSIS — Z1339 Encounter for screening examination for other mental health and behavioral disorders: Secondary | ICD-10-CM | POA: Diagnosis not present

## 2017-12-05 DIAGNOSIS — G43909 Migraine, unspecified, not intractable, without status migrainosus: Secondary | ICD-10-CM | POA: Diagnosis not present

## 2017-12-05 DIAGNOSIS — R5383 Other fatigue: Secondary | ICD-10-CM | POA: Diagnosis not present

## 2017-12-05 DIAGNOSIS — Z7189 Other specified counseling: Secondary | ICD-10-CM | POA: Diagnosis not present

## 2018-01-04 DIAGNOSIS — R531 Weakness: Secondary | ICD-10-CM | POA: Diagnosis not present

## 2018-01-04 DIAGNOSIS — M5416 Radiculopathy, lumbar region: Secondary | ICD-10-CM | POA: Diagnosis not present

## 2018-01-04 DIAGNOSIS — M545 Low back pain: Secondary | ICD-10-CM | POA: Diagnosis not present

## 2018-01-31 DIAGNOSIS — M545 Low back pain: Secondary | ICD-10-CM | POA: Diagnosis not present

## 2018-01-31 DIAGNOSIS — M5416 Radiculopathy, lumbar region: Secondary | ICD-10-CM | POA: Diagnosis not present

## 2018-01-31 DIAGNOSIS — R531 Weakness: Secondary | ICD-10-CM | POA: Diagnosis not present

## 2018-02-02 DIAGNOSIS — M5416 Radiculopathy, lumbar region: Secondary | ICD-10-CM | POA: Diagnosis not present

## 2018-02-02 DIAGNOSIS — R531 Weakness: Secondary | ICD-10-CM | POA: Diagnosis not present

## 2018-02-02 DIAGNOSIS — M545 Low back pain: Secondary | ICD-10-CM | POA: Diagnosis not present

## 2018-02-15 DIAGNOSIS — F419 Anxiety disorder, unspecified: Secondary | ICD-10-CM | POA: Diagnosis not present

## 2018-02-15 DIAGNOSIS — I10 Essential (primary) hypertension: Secondary | ICD-10-CM | POA: Diagnosis not present

## 2018-02-15 DIAGNOSIS — Z6824 Body mass index (BMI) 24.0-24.9, adult: Secondary | ICD-10-CM | POA: Diagnosis not present

## 2018-02-15 DIAGNOSIS — E7800 Pure hypercholesterolemia, unspecified: Secondary | ICD-10-CM | POA: Diagnosis not present

## 2018-02-15 DIAGNOSIS — Z299 Encounter for prophylactic measures, unspecified: Secondary | ICD-10-CM | POA: Diagnosis not present

## 2018-02-15 DIAGNOSIS — E1165 Type 2 diabetes mellitus with hyperglycemia: Secondary | ICD-10-CM | POA: Diagnosis not present

## 2018-04-05 DIAGNOSIS — M5416 Radiculopathy, lumbar region: Secondary | ICD-10-CM | POA: Diagnosis not present

## 2018-04-05 DIAGNOSIS — R531 Weakness: Secondary | ICD-10-CM | POA: Diagnosis not present

## 2018-04-05 DIAGNOSIS — I10 Essential (primary) hypertension: Secondary | ICD-10-CM | POA: Diagnosis not present

## 2018-04-05 DIAGNOSIS — M545 Low back pain: Secondary | ICD-10-CM | POA: Diagnosis not present

## 2018-04-20 DIAGNOSIS — J069 Acute upper respiratory infection, unspecified: Secondary | ICD-10-CM | POA: Diagnosis not present

## 2018-04-20 DIAGNOSIS — E1165 Type 2 diabetes mellitus with hyperglycemia: Secondary | ICD-10-CM | POA: Diagnosis not present

## 2018-04-20 DIAGNOSIS — Z6824 Body mass index (BMI) 24.0-24.9, adult: Secondary | ICD-10-CM | POA: Diagnosis not present

## 2018-04-20 DIAGNOSIS — Z299 Encounter for prophylactic measures, unspecified: Secondary | ICD-10-CM | POA: Diagnosis not present

## 2018-04-20 DIAGNOSIS — R42 Dizziness and giddiness: Secondary | ICD-10-CM | POA: Diagnosis not present

## 2018-04-20 DIAGNOSIS — N390 Urinary tract infection, site not specified: Secondary | ICD-10-CM | POA: Diagnosis not present

## 2018-04-20 DIAGNOSIS — R350 Frequency of micturition: Secondary | ICD-10-CM | POA: Diagnosis not present

## 2018-04-20 DIAGNOSIS — I10 Essential (primary) hypertension: Secondary | ICD-10-CM | POA: Diagnosis not present

## 2018-05-08 DIAGNOSIS — E1159 Type 2 diabetes mellitus with other circulatory complications: Secondary | ICD-10-CM | POA: Diagnosis not present

## 2018-05-08 DIAGNOSIS — H81393 Other peripheral vertigo, bilateral: Secondary | ICD-10-CM | POA: Diagnosis not present

## 2018-05-08 DIAGNOSIS — E1140 Type 2 diabetes mellitus with diabetic neuropathy, unspecified: Secondary | ICD-10-CM | POA: Diagnosis not present

## 2018-05-16 DIAGNOSIS — I10 Essential (primary) hypertension: Secondary | ICD-10-CM | POA: Diagnosis not present

## 2018-05-16 DIAGNOSIS — R05 Cough: Secondary | ICD-10-CM | POA: Diagnosis not present

## 2018-05-16 DIAGNOSIS — Z6823 Body mass index (BMI) 23.0-23.9, adult: Secondary | ICD-10-CM | POA: Diagnosis not present

## 2018-05-16 DIAGNOSIS — R1110 Vomiting, unspecified: Secondary | ICD-10-CM | POA: Diagnosis not present

## 2018-05-16 DIAGNOSIS — Z2821 Immunization not carried out because of patient refusal: Secondary | ICD-10-CM | POA: Diagnosis not present

## 2018-05-16 DIAGNOSIS — Z789 Other specified health status: Secondary | ICD-10-CM | POA: Diagnosis not present

## 2018-05-16 DIAGNOSIS — Z299 Encounter for prophylactic measures, unspecified: Secondary | ICD-10-CM | POA: Diagnosis not present

## 2018-05-16 DIAGNOSIS — E1165 Type 2 diabetes mellitus with hyperglycemia: Secondary | ICD-10-CM | POA: Diagnosis not present

## 2018-05-19 DIAGNOSIS — Z299 Encounter for prophylactic measures, unspecified: Secondary | ICD-10-CM | POA: Diagnosis not present

## 2018-05-19 DIAGNOSIS — E1165 Type 2 diabetes mellitus with hyperglycemia: Secondary | ICD-10-CM | POA: Diagnosis not present

## 2018-05-19 DIAGNOSIS — Z6823 Body mass index (BMI) 23.0-23.9, adult: Secondary | ICD-10-CM | POA: Diagnosis not present

## 2018-05-19 DIAGNOSIS — I10 Essential (primary) hypertension: Secondary | ICD-10-CM | POA: Diagnosis not present

## 2018-05-19 DIAGNOSIS — R05 Cough: Secondary | ICD-10-CM | POA: Diagnosis not present

## 2018-05-23 DIAGNOSIS — M1710 Unilateral primary osteoarthritis, unspecified knee: Secondary | ICD-10-CM | POA: Diagnosis not present

## 2018-05-23 DIAGNOSIS — Z299 Encounter for prophylactic measures, unspecified: Secondary | ICD-10-CM | POA: Diagnosis not present

## 2018-05-23 DIAGNOSIS — Z6824 Body mass index (BMI) 24.0-24.9, adult: Secondary | ICD-10-CM | POA: Diagnosis not present

## 2018-05-23 DIAGNOSIS — I10 Essential (primary) hypertension: Secondary | ICD-10-CM | POA: Diagnosis not present

## 2018-05-23 DIAGNOSIS — E1165 Type 2 diabetes mellitus with hyperglycemia: Secondary | ICD-10-CM | POA: Diagnosis not present

## 2018-05-23 DIAGNOSIS — Z23 Encounter for immunization: Secondary | ICD-10-CM | POA: Diagnosis not present

## 2018-06-05 DIAGNOSIS — R1310 Dysphagia, unspecified: Secondary | ICD-10-CM | POA: Diagnosis not present

## 2018-06-05 DIAGNOSIS — R42 Dizziness and giddiness: Secondary | ICD-10-CM | POA: Diagnosis not present

## 2018-06-05 DIAGNOSIS — Z299 Encounter for prophylactic measures, unspecified: Secondary | ICD-10-CM | POA: Diagnosis not present

## 2018-06-05 DIAGNOSIS — I10 Essential (primary) hypertension: Secondary | ICD-10-CM | POA: Diagnosis not present

## 2018-06-05 DIAGNOSIS — R2689 Other abnormalities of gait and mobility: Secondary | ICD-10-CM | POA: Diagnosis not present

## 2018-06-05 DIAGNOSIS — Z6824 Body mass index (BMI) 24.0-24.9, adult: Secondary | ICD-10-CM | POA: Diagnosis not present

## 2018-06-13 DIAGNOSIS — H81393 Other peripheral vertigo, bilateral: Secondary | ICD-10-CM | POA: Diagnosis not present

## 2018-06-21 DIAGNOSIS — S299XXA Unspecified injury of thorax, initial encounter: Secondary | ICD-10-CM | POA: Diagnosis not present

## 2018-06-21 DIAGNOSIS — R0789 Other chest pain: Secondary | ICD-10-CM | POA: Diagnosis not present

## 2018-06-21 DIAGNOSIS — R0781 Pleurodynia: Secondary | ICD-10-CM | POA: Diagnosis not present

## 2018-06-21 DIAGNOSIS — W19XXXA Unspecified fall, initial encounter: Secondary | ICD-10-CM | POA: Diagnosis not present

## 2018-06-21 DIAGNOSIS — Y92009 Unspecified place in unspecified non-institutional (private) residence as the place of occurrence of the external cause: Secondary | ICD-10-CM | POA: Diagnosis not present

## 2018-06-21 DIAGNOSIS — Z299 Encounter for prophylactic measures, unspecified: Secondary | ICD-10-CM | POA: Diagnosis not present

## 2018-06-21 DIAGNOSIS — I10 Essential (primary) hypertension: Secondary | ICD-10-CM | POA: Diagnosis not present

## 2018-06-21 DIAGNOSIS — S20219A Contusion of unspecified front wall of thorax, initial encounter: Secondary | ICD-10-CM | POA: Diagnosis not present

## 2018-06-21 DIAGNOSIS — Z6824 Body mass index (BMI) 24.0-24.9, adult: Secondary | ICD-10-CM | POA: Diagnosis not present

## 2018-06-26 DIAGNOSIS — I10 Essential (primary) hypertension: Secondary | ICD-10-CM | POA: Diagnosis not present

## 2018-06-26 DIAGNOSIS — M545 Low back pain: Secondary | ICD-10-CM | POA: Diagnosis not present

## 2018-06-26 DIAGNOSIS — Z6830 Body mass index (BMI) 30.0-30.9, adult: Secondary | ICD-10-CM | POA: Diagnosis not present

## 2018-06-26 DIAGNOSIS — M5416 Radiculopathy, lumbar region: Secondary | ICD-10-CM | POA: Diagnosis not present

## 2018-06-27 ENCOUNTER — Ambulatory Visit (INDEPENDENT_AMBULATORY_CARE_PROVIDER_SITE_OTHER): Payer: PPO | Admitting: Internal Medicine

## 2018-06-27 ENCOUNTER — Encounter (INDEPENDENT_AMBULATORY_CARE_PROVIDER_SITE_OTHER): Payer: Self-pay | Admitting: Internal Medicine

## 2018-06-27 ENCOUNTER — Encounter (INDEPENDENT_AMBULATORY_CARE_PROVIDER_SITE_OTHER): Payer: Self-pay | Admitting: *Deleted

## 2018-06-27 VITALS — BP 120/76 | HR 74 | Temp 97.8°F | Ht 63.0 in | Wt 163.6 lb

## 2018-06-27 DIAGNOSIS — R1310 Dysphagia, unspecified: Secondary | ICD-10-CM | POA: Insufficient documentation

## 2018-06-27 DIAGNOSIS — K222 Esophageal obstruction: Secondary | ICD-10-CM

## 2018-06-27 DIAGNOSIS — R1319 Other dysphagia: Secondary | ICD-10-CM | POA: Insufficient documentation

## 2018-06-27 NOTE — Progress Notes (Signed)
Subjective:    Patient ID: Wendy Fuller, female    DOB: [birth year], 77 y.o.   MRN: [id number]  HPI Referred by Dr. Woody Seller for dysphagia. Hx of Schatzki's ring.  Her last EGD/ed was in 2015 [place] is unable to eat meat at night.  If she eats bread, sometimes the bread will lodge.  Her appetite is okay. No weight loss. Has a BM      12/21/2013 EGD/ED.  Impression: Schatzki's ring and a small sliding hiatal hernia. Ring disrupted by passing 34 French Maloney dilator and focal biopsy. Two small hyperplastic appearing polyps at gastric body. Esophageal biopsy taken to rule out eosinophilic esophagitis.  08/01/2013 EGD/ED Dr. Laural Golden, dysphagia: Impression:  Erosive reflux esophagitis with prominent Schatzki's ring.   Small sliding hiatal hernia.  Esophagus dilated by passing 56 French Maloney dilator resulting in mucosal disruption proximal to GE junction along with disruption of the ring. Schatzki's ring was further disrupted with focal biopsy at two sites.   Review of Systems Past Medical History:  Diagnosis Date  . Back pain   . Diabetes ([place])    x 3 yrs  . Hypertension     Past Surgical History:  Procedure Laterality Date  . ABDOMINAL HYSTERECTOMY    . BALLOON DILATION N/A 12/21/2013   Procedure: BALLOON DILATION;  Surgeon: Rogene Houston, MD;  [facility];  Service: Endoscopy;  Laterality: N/A;  . complete hyster    . ESOPHAGOGASTRODUODENOSCOPY N/A 12/21/2013   Procedure: ESOPHAGOGASTRODUODENOSCOPY (EGD);  Surgeon: Rogene Houston, MD;  [facility];  Service: Endoscopy;  Laterality: N/A;  730  . ESOPHAGOGASTRODUODENOSCOPY (EGD) WITH ESOPHAGEAL DILATION N/A 08/01/2013   Procedure: ESOPHAGOGASTRODUODENOSCOPY (EGD) WITH ESOPHAGEAL DILATION;  Surgeon: Rogene Houston, MD;  [facility];  Service: Endoscopy;  Laterality: N/A;  345-moved to 100 [place] notified pt  . MALONEY DILATION N/A 12/21/2013   Procedure: Venia Minks DILATION;  Surgeon: Rogene Houston, MD;  [facility];  Service: Endoscopy;  Laterality: N/A;  . SAVORY DILATION N/A 12/21/2013   Procedure: SAVORY DILATION;  Surgeon: Rogene Houston, MD;  [facility];  Service: Endoscopy;  Laterality: N/A;  . SHOULDER SURGERY    . THYROID SURGERY      Allergies  Allergen Reactions  . Penicillins Hives    Current Outpatient Medications on File Prior to Visit  Medication Sig Dispense Refill  . aspirin EC 81 MG tablet Take 81 mg by mouth daily.    . chlordiazePOXIDE-amitriptyline (LIMBITROL DS) 10-25 MG TABS Take 1 tablet by mouth 2 (two) times daily.    Marland Kitchen gabapentin (NEURONTIN) 300 MG capsule Take 300 mg by mouth 3 (three) times daily.      Marland Kitchen glimepiride (AMARYL) 4 MG tablet Take 4 mg by mouth daily before breakfast.      . lisinopril-hydrochlorothiazide (PRINZIDE,ZESTORETIC) 10-12.5 MG per tablet Take 1 tablet by mouth daily.      . metFORMIN (GLUCOPHAGE) 500 MG tablet Take 500 mg by mouth 2 (two) times daily with a meal.     . oxymorphone (OPANA) 5 MG tablet Take 5 mg by mouth every 6 (six) hours as needed for pain.      No current facility-administered medications on file prior to visit.         Objective:   Physical Exam Blood pressure 120/76, pulse 74, temperature 97.8 F (36.6 C), height 5\' 3"  (1.6 m), weight 163 lb 9.6 oz (74.2 kg). Alert and oriented.  Skin warm and dry. Oral mucosa is moist.   . Sclera anicteric, conjunctivae is pink. Thyroid not enlarged. No cervical lymphadenopathy. Lungs clear. Heart regular rate and rhythm.  Abdomen is soft. Bowel sounds are positive. No hepatomegaly. No abdominal masses felt. No tenderness.  No edema to lower extremities.            Assessment & Plan:  Dysphagia. Hx of Schatzki's ring. EGD/ED.  The risks of bleeding, perforation and infection were reviewed with patient.

## 2018-06-27 NOTE — Patient Instructions (Signed)
The risks of bleeding, perforation and infection were reviewed with patient.  

## 2018-07-06 DIAGNOSIS — I10 Essential (primary) hypertension: Secondary | ICD-10-CM | POA: Diagnosis not present

## 2018-07-06 DIAGNOSIS — E559 Vitamin D deficiency, unspecified: Secondary | ICD-10-CM | POA: Diagnosis not present

## 2018-07-06 DIAGNOSIS — R2689 Other abnormalities of gait and mobility: Secondary | ICD-10-CM | POA: Diagnosis not present

## 2018-07-06 DIAGNOSIS — Z6824 Body mass index (BMI) 24.0-24.9, adult: Secondary | ICD-10-CM | POA: Diagnosis not present

## 2018-07-06 DIAGNOSIS — Z87891 Personal history of nicotine dependence: Secondary | ICD-10-CM | POA: Diagnosis not present

## 2018-07-06 DIAGNOSIS — Z299 Encounter for prophylactic measures, unspecified: Secondary | ICD-10-CM | POA: Diagnosis not present

## 2018-07-24 ENCOUNTER — Encounter (HOSPITAL_COMMUNITY): Payer: Self-pay | Admitting: *Deleted

## 2018-07-26 ENCOUNTER — Ambulatory Visit (HOSPITAL_COMMUNITY)
Admission: RE | Admit: 2018-07-26 | Discharge: 2018-07-26 | Disposition: A | Discharge status: Home / Self Care | Payer: PPO | Source: Ambulatory Visit | Attending: Internal Medicine | Admitting: Internal Medicine

## 2018-07-26 ENCOUNTER — Encounter (HOSPITAL_COMMUNITY)
Admission: RE | Disposition: A | Discharge status: Home / Self Care | Payer: Self-pay | Source: Ambulatory Visit | Attending: Internal Medicine

## 2018-07-26 ENCOUNTER — Other Ambulatory Visit: Payer: Self-pay

## 2018-07-26 ENCOUNTER — Encounter (HOSPITAL_COMMUNITY): Payer: Self-pay | Admitting: *Deleted

## 2018-07-26 DIAGNOSIS — K209 Esophagitis, unspecified: Secondary | ICD-10-CM | POA: Diagnosis not present

## 2018-07-26 DIAGNOSIS — Z7984 Long term (current) use of oral hypoglycemic drugs: Secondary | ICD-10-CM | POA: Diagnosis not present

## 2018-07-26 DIAGNOSIS — K222 Esophageal obstruction: Secondary | ICD-10-CM | POA: Diagnosis not present

## 2018-07-26 DIAGNOSIS — Z79899 Other long term (current) drug therapy: Secondary | ICD-10-CM | POA: Diagnosis not present

## 2018-07-26 DIAGNOSIS — I10 Essential (primary) hypertension: Secondary | ICD-10-CM | POA: Diagnosis not present

## 2018-07-26 DIAGNOSIS — R1319 Other dysphagia: Secondary | ICD-10-CM | POA: Insufficient documentation

## 2018-07-26 DIAGNOSIS — R1314 Dysphagia, pharyngoesophageal phase: Secondary | ICD-10-CM | POA: Diagnosis not present

## 2018-07-26 DIAGNOSIS — E119 Type 2 diabetes mellitus without complications: Secondary | ICD-10-CM | POA: Diagnosis not present

## 2018-07-26 DIAGNOSIS — K449 Diaphragmatic hernia without obstruction or gangrene: Secondary | ICD-10-CM | POA: Diagnosis not present

## 2018-07-26 DIAGNOSIS — Z7982 Long term (current) use of aspirin: Secondary | ICD-10-CM | POA: Insufficient documentation

## 2018-07-26 DIAGNOSIS — R1310 Dysphagia, unspecified: Secondary | ICD-10-CM

## 2018-07-26 HISTORY — PX: ESOPHAGEAL DILATION: SHX303

## 2018-07-26 HISTORY — PX: ESOPHAGOGASTRODUODENOSCOPY: SHX5428

## 2018-07-26 LAB — GLUCOSE, CAPILLARY
Glucose-Capillary: 73 mg/dL (ref 70–99)
Glucose-Capillary: 161 mg/dL — ABNORMAL HIGH (ref 70–99)

## 2018-07-26 SURGERY — EGD (ESOPHAGOGASTRODUODENOSCOPY)
Anesthesia: Moderate Sedation

## 2018-07-26 MED ORDER — MIDAZOLAM HCL 5 MG/5ML IJ SOLN
INTRAMUSCULAR | Status: DC | PRN
  Administered 2018-07-26: 1 mg via INTRAVENOUS
  Administered 2018-07-26: 2 mg via INTRAVENOUS
  Administered 2018-07-26: 1 mg via INTRAVENOUS

## 2018-07-26 MED ORDER — FAMOTIDINE 20 MG PO TABS: 20.0000 mg | ORAL_TABLET | Freq: Two times a day (BID) | ORAL | 5 refills | Status: DC

## 2018-07-26 MED ORDER — MEPERIDINE HCL 50 MG/ML IJ SOLN
INTRAMUSCULAR | Status: AC
  Filled 2018-07-26: qty 1

## 2018-07-26 MED ORDER — DEXTROSE 5 % AND 0.9 % NACL IV BOLUS
INTRAVENOUS | Status: AC | PRN
  Administered 2018-07-26: 1000 mL via INTRAVENOUS

## 2018-07-26 MED ORDER — MIDAZOLAM HCL 5 MG/5ML IJ SOLN
INTRAMUSCULAR | Status: AC
  Filled 2018-07-26: qty 10

## 2018-07-26 MED ORDER — MEPERIDINE HCL 50 MG/ML IJ SOLN
INTRAMUSCULAR | Status: DC | PRN
  Administered 2018-07-26: 15 mg via INTRAVENOUS
  Administered 2018-07-26: 25 mg via INTRAVENOUS

## 2018-07-26 MED ORDER — LIDOCAINE VISCOUS HCL 2 % MT SOLN
OROMUCOSAL | Status: DC | PRN
  Administered 2018-07-26: 4 mL via OROMUCOSAL

## 2018-07-26 MED ORDER — SODIUM CHLORIDE 0.9 % IV SOLN
INTRAVENOUS | Status: DC
  Administered 2018-07-26: 07:00:00 via INTRAVENOUS

## 2018-07-26 MED ORDER — STERILE WATER FOR IRRIGATION IR SOLN
Status: DC | PRN
  Administered 2018-07-26: 100 mL

## 2018-07-26 MED ORDER — LIDOCAINE VISCOUS HCL 2 % MT SOLN
OROMUCOSAL | Status: AC
  Filled 2018-07-26: qty 15

## 2018-07-26 NOTE — H&P (Signed)
Wendy Fuller is an 77 y.o. female.   Chief Complaint: Patient is here for EGD and ED. HPI: Patient is 77 year old Caucasian female who presents with 3 to 42-month history of dysphagia to solids.  She has most difficulty with me.  She points to lower sternal area sort of bolus obstruction.  She rarely experiences heartburn.  She has good appetite and has not lost any weight.  Last EGD with dilation was 4-1/2 years ago when she was found to have Schatzki's ring and small hiatal hernia. Last aspirin dose was yesterday.  Past Medical History:  Diagnosis Date  . Back pain   . Diabetes ([place])    x 3 yrs  . Hypertension     Past Surgical History:  Procedure Laterality Date  . ABDOMINAL HYSTERECTOMY    . BALLOON DILATION N/A 12/21/2013   Procedure: BALLOON DILATION;  Surgeon: Rogene Houston, MD;  [facility];  Service: Endoscopy;  Laterality: N/A;  . complete hyster    . ESOPHAGOGASTRODUODENOSCOPY N/A 12/21/2013   Procedure: ESOPHAGOGASTRODUODENOSCOPY (EGD);  Surgeon: Rogene Houston, MD;  [facility];  Service: Endoscopy;  Laterality: N/A;  730  . ESOPHAGOGASTRODUODENOSCOPY (EGD) WITH ESOPHAGEAL DILATION N/A 08/01/2013   Procedure: ESOPHAGOGASTRODUODENOSCOPY (EGD) WITH ESOPHAGEAL DILATION;  Surgeon: Rogene Houston, MD;  [facility];  Service: Endoscopy;  Laterality: N/A;  345-moved to 100 [place] notified pt  . MALONEY DILATION N/A 12/21/2013   Procedure: Venia Minks DILATION;  Surgeon: Rogene Houston, MD;  [facility];  Service: Endoscopy;  Laterality: N/A;  . SAVORY DILATION N/A 12/21/2013   Procedure: SAVORY DILATION;  Surgeon: Rogene Houston, MD;  [facility];  Service: Endoscopy;  Laterality: N/A;  . SHOULDER SURGERY    . THYROID SURGERY      History reviewed. No pertinent family history. Social History:  reports that she has never smoked. She has never used smokeless tobacco. She reports that she does not drink alcohol or use  drugs.  Allergies: No Known Allergies  Medications Prior to Admission  Medication Sig Dispense Refill  . aspirin EC 81 MG tablet Take 81 mg by mouth daily.    Marland Kitchen atorvastatin (LIPITOR) 10 MG tablet Take 10 mg by mouth daily.    . chlordiazePOXIDE-amitriptyline (LIMBITROL DS) 10-25 MG TABS Take 1 tablet by mouth 2 (two) times daily.    . Cholecalciferol (VITAMIN D3) 125 MCG (5000 UT) TABS Take 5,000 Units by mouth daily.    . folic acid (FOLVITE) 423 MCG tablet Take 400 mcg by mouth daily.    Marland Kitchen gabapentin (NEURONTIN) 300 MG capsule Take 300-600 mg by mouth See admin instructions. Take 1 capsule (300 mg) by mouth in the morning & take 2 capsules (600 mg) by mouth at night.    Marland Kitchen glimepiride (AMARYL) 4 MG tablet Take 4 mg by mouth daily before breakfast.      . lisinopril-hydrochlorothiazide (PRINZIDE,ZESTORETIC) 10-12.5 MG per tablet Take 1 tablet by mouth daily.      . meclizine (ANTIVERT) 25 MG tablet Take 12.5-25 mg by mouth 3 (three) times daily as needed for dizziness.  2  . metFORMIN (GLUCOPHAGE) 500 MG tablet Take 500-1,000 mg by mouth See admin instructions. Take 1 tablet (500 mg) by mouth in the morning & take 2 tablets (1000 mg) by mouth in the evening.    . Multiple Vitamin (MULTIVITAMIN WITH MINERALS) TABS tablet Take 1 tablet by mouth daily. ALIVE MULTIVITAMIN    . niacin  500 MG tablet Take 500 mg by mouth daily.    Marland Kitchen oxyCODONE-acetaminophen (PERCOCET) 10-325 MG tablet Take 1 tablet by mouth every 4 (four) hours as needed for pain.     . pioglitazone (ACTOS) 30 MG tablet Take 30 mg by mouth every evening.   4  . Polyethyl Glycol-Propyl Glycol (LUBRICANT EYE DROPS) 0.4-0.3 % SOLN Place 1 drop into both eyes 3 (three) times daily as needed (for dry/irritated eyes.).    Marland Kitchen promethazine (PHENERGAN) 25 MG tablet Take 25 mg by mouth every 6 (six) hours as needed. for severe nausea  1  . tiZANidine (ZANAFLEX) 4 MG tablet Take 4 mg by mouth 3 (three) times daily as needed for muscle spasms.  2     Results for orders placed or performed during the hospital encounter of 07/26/18 (from the past 48 hour(s))  Glucose, capillary     Status: None   Collection Time: 07/26/18  7:04 AM  Result Value Ref Range   Glucose-Capillary 73 70 - 99 mg/dL   No results found.  ROS  Blood pressure 124/65, pulse 97, temperature 97.7 F (36.5 C), resp. rate 17, height 5\' 3"  (1.6 m), weight 74.2 kg, SpO2 99 %. Physical Exam  Constitutional: She appears well-developed and well-nourished.  HENT:  Mouth/Throat: Oropharynx is clear and moist.  Eyes: Conjunctivae are normal. No scleral icterus.  Neck: No thyromegaly present.  Cardiovascular: Normal rate, regular rhythm and normal heart sounds.  No murmur heard. Respiratory: Effort normal and breath sounds normal.  GI: Soft. She exhibits no distension and no mass. There is no tenderness.  Pfannenstiel scar.  Musculoskeletal: She exhibits no edema.  Lymphadenopathy:    She has no cervical adenopathy.  Neurological: She is alert.  Skin: Skin is warm and dry.     Assessment/Plan Solid food dysphagia. History of Schatzki's ring. EGD with ED.  Hildred Laser, MD 07/26/2018, 7:31 AM

## 2018-07-26 NOTE — Discharge Instructions (Signed)
Esophageal Dilatation Esophageal dilatation is a procedure to open a blocked or narrowed part of the esophagus. The esophagus is the long tube in your throat that carries food and liquid from your mouth to your stomach. The procedure is also called esophageal dilation. You may need this procedure if you have a buildup of scar tissue in your esophagus that makes it difficult, painful, or even impossible to swallow. This can be caused by gastroesophageal reflux disease (GERD). In rare cases, people need this procedure because they have cancer of the esophagus or a problem with the way food moves through the esophagus. Sometimes you may need to have another dilatation to enlarge the opening of the esophagus gradually. Tell a health care provider about:  Any allergies you have.  All medicines you are taking, including vitamins, herbs, eye drops, creams, and over-the-counter medicines.  Any problems you or family members have had with anesthetic medicines.  Any blood disorders you have.  Any surgeries you have had.  Any medical conditions you have.  Any antibiotic medicines you are required to take before dental procedures. What are the risks? Generally, this is a safe procedure. However, problems can occur and include:  Bleeding from a tear in the lining of the esophagus.  A hole (perforation) in the esophagus.  What happens before the procedure?  Do not eat or drink anything after midnight on the night before the procedure or as directed by your health care provider.  Ask your health care provider about changing or stopping your regular medicines. This is especially important if you are taking diabetes medicines or blood thinners.  Plan to have someone take you home after the procedure. What happens during the procedure?  You will be given a medicine that makes you relaxed and sleepy (sedative).  A medicine may be sprayed or gargled to numb the back of the throat.  Your health care  provider can use various instruments to do an esophageal dilatation. During the procedure, the instrument used will be placed in your mouth and passed down into your esophagus. Options include: ? Simple dilators. This instrument is carefully placed in the esophagus to stretch it. ? Guided wire bougies. In this method, a flexible tube (endoscope) is used to insert a wire into the esophagus. The dilator is passed over this wire to enlarge the esophagus. Then the wire is removed. ? Balloon dilators. An endoscope with a small balloon at the end is passed down into the esophagus. Inflating the balloon gently stretches the esophagus and opens it up. What happens after the procedure?  Your blood pressure, heart rate, breathing rate, and blood oxygen level will be monitored often until the medicines you were given have worn off.  Your throat may feel slightly sore and will probably still feel numb. This will improve slowly over time.  You will not be allowed to eat or drink until the throat numbness has resolved.  If this is a same-day procedure, you may be allowed to go home once you have been able to drink, urinate, and sit on the edge of the bed without nausea or dizziness.  If this is a same-day procedure, you should have a friend or family member with you for the next 24 hours after the procedure. This information is not intended to replace advice given to you by your health care provider. Make sure you discuss any questions you have with your health care provider. Document Released: 09/30/2005 Document Revised: 01/15/2016 Document Reviewed: 12/19/2013 Elsevier Interactive  Patient Education  2018 [employer]. Esophagogastroduodenoscopy, Care After Refer to this sheet in the next few weeks. These instructions provide you with information about caring for yourself after your procedure. Your health care provider may also give you more specific instructions. Your treatment has been planned according to  current medical practices, but problems sometimes occur. Call your health care provider if you have any problems or questions after your procedure. What can I expect after the procedure? After the procedure, it is common to have:  A sore throat.  Nausea.  Bloating.  Dizziness.  Fatigue.  Follow these instructions at home:  Do not eat or drink anything until the numbing medicine (local anesthetic) has worn off and your gag reflex has returned. You will know that the local anesthetic has worn off when you can swallow comfortably.  Do not drive for 24 hours if you received a medicine to help you relax (sedative).  If your health care provider took a tissue sample for testing during the procedure, make sure to get your test results. This is your responsibility. Ask your health care provider or the department performing the test when your results will be ready.  Keep all follow-up visits as told by your health care provider. This is important. Contact a health care provider if:  You cannot stop coughing.  You are not urinating.  You are urinating less than usual. Get help right away if:  You have trouble swallowing.  You cannot eat or drink.  You have throat or chest pain that gets worse.  You are dizzy or light-headed.  You faint.  You have nausea or vomiting.  You have chills.  You have a fever.  You have severe abdominal pain.  You have black, tarry, or bloody stools. This information is not intended to replace advice given to you by your health care provider. Make sure you discuss any questions you have with your health care provider. Document Released: 07/26/2012 Document Revised: 01/15/2016 Document Reviewed: 07/03/2015 Elsevier Interactive Patient Education  2018 [employer]. Resume aspirin on 07/27/2018. Resume other medications as before. Famotidine 20 mg by mouth before breakfast and evening meal daily. Resume usual diet. No driving for 24  hours. Please call office with progress report in 1 week.

## 2018-07-27 NOTE — Op Note (Signed)
[facility] Patient Name: Wendy Fuller Procedure Date: 07/26/2018 7:28 AM MRN: [id number] Date of Birth: 04/04/1941 Attending MD: Hildred Laser , MD CSN: [id number] Age: 77 Admit Type: Outpatient Procedure:                Upper GI endoscopy Indications:              Esophageal dysphagia Providers:                Hildred Laser, MD, Rosina Lowenstein, RN, Nelma Rothman,                            Technician Referring MD:             Glenda Chroman, MD Medicines:                Lidocaine spray, Meperidine 40 mg IV, Midazolam 4                            mg IV Complications:            No immediate complications. Estimated Blood Loss:     Estimated blood loss was minimal. Procedure:                Pre-Anesthesia Assessment:                           - Prior to the procedure, a History and Physical                            was performed, and patient medications and                            allergies were reviewed. The patient's tolerance of                            previous anesthesia was also reviewed. The risks                            and benefits of the procedure and the sedation                            options and risks were discussed with the patient.                            All questions were answered, and informed consent                            was obtained. Prior Anticoagulants: The patient                            last took aspirin 1 day prior to the procedure. ASA                            Grade Assessment: III - A patient with severe  systemic disease. After reviewing the risks and                            benefits, the patient was deemed in satisfactory                            condition to undergo the procedure.                           After obtaining informed consent, the endoscope was                            passed under direct vision. Throughout the                            procedure, the patient's blood pressure, pulse,  and                            oxygen saturations were monitored continuously. The                            GIF-H190 ([id number]) scope was introduced through the                            mouth, and advanced to the second part of duodenum.                            The upper GI endoscopy was accomplished without                            difficulty. The patient tolerated the procedure                            well. Scope In: 7:40:36 AM Scope Out: 7:50:39 AM Total Procedure Duration: 0 hours 10 minutes 3 seconds  Findings:      LA Grade A (one or more mucosal breaks less than 5 mm, not extending       between tops of 2 mucosal folds) esophagitis was found 31 cm from the       incisors.      One benign-appearing, intrinsic mild stenosis was found at GEJ, 31 cm       from the incisors. The stenosis was traversed. A TTS dilator was passed       through the scope. Dilation with an 18-19-20 mm balloon dilator was       performed to 18 mm. The dilation site was examined and showed mild       mucosal disruption, moderate improvement in luminal narrowing and no       perforation.      A 2 cm hiatal hernia was present.      The entire examined stomach was normal.      The duodenal bulb and second portion of the duodenum were normal. Impression:               - LA Grade A esophagitis.                           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                            Dilated.                           - 2 cm hiatal hernia.                           - Normal stomach.                           - Normal duodenal bulb and second portion of the                            duodenum.                           - No specimens collected. Moderate Sedation:      Moderate (conscious) sedation was administered by the endoscopy nurse       and supervised by the endoscopist. The following parameters were       monitored: oxygen saturation, heart rate, blood pressure, CO2        capnography and response to care. Total physician intraservice time was       14 minutes. Recommendation:           - Patient has a contact number available for                            emergencies. The signs and symptoms of potential                            delayed complications were discussed with the                            patient. Return to normal activities tomorrow.                            Written discharge instructions were provided to the                            patient.                           - Resume previous diet today.                           - Continue present medications.                           - No aspirin, ibuprofen, naproxen, or other                            non-steroidal anti-inflammatory drugs for 1 day.                           - Use Pepcid (famotidine) 20 mg PO BID.                           -  Telephone GI clinic in 1 week. Procedure Code(s):        --- Professional ---                           [phone], Esophagogastroduodenoscopy, flexible,                            transoral; with transendoscopic balloon dilation of                            esophagus (less than 30 mm diameter)                           G0500, Moderate sedation services provided by the                            same physician or other qualified health care                            professional performing a gastrointestinal                            endoscopic service that sedation supports,                            requiring the presence of an independent trained                            observer to assist in the monitoring of the                            patient's level of consciousness and physiological                            status; initial 15 minutes of intra-service time;                            patient age 24 years or older (additional time may                            be reported with [phone], as appropriate) Diagnosis Code(s):        ---  Professional ---                           K20.9, Esophagitis, unspecified                           K22.2, Esophageal obstruction                           K44.9, Diaphragmatic hernia without obstruction or                            gangrene  R13.14, Dysphagia, pharyngoesophageal phase CPT copyright 2018 American Medical Association. All rights reserved. The codes documented in this report are preliminary and upon coder review may  be revised to meet current compliance requirements. Hildred Laser, MD Hildred Laser, MD 07/26/2018 8:01:40 AM This report has been signed electronically. Number of Addenda: 0

## 2018-08-01 ENCOUNTER — Encounter (HOSPITAL_COMMUNITY): Payer: Self-pay | Admitting: Internal Medicine

## 2018-08-11 ENCOUNTER — Other Ambulatory Visit: Payer: Self-pay | Admitting: Pharmacist

## 2018-08-11 NOTE — Patient Outreach (Signed)
[facility]) Care Management  08/11/2018  LIARA HOLM 07/24/1941 [id number]   Outreach call to [name] regarding her request for follow up from the [facility] Medication Adherence Campaign, which contacted her regarding adherence to lisinopril-hydrochlorothiazide.  Call and speak with patient, who states that now is not a good time to talk. Patient asks that I call back again on Monday morning. Will call patient back at that time.  Harlow Asa, PharmD, [place] [facility] [phone]

## 2018-08-14 ENCOUNTER — Other Ambulatory Visit: Payer: Self-pay | Admitting: Pharmacist

## 2018-08-14 NOTE — Patient Outreach (Signed)
[facility]) Care Management  08/14/2018  Wendy Fuller [birth year] [id number]   Outreach call to Wendy Fuller again regarding her request for follow up from the [facility] Medication Adherence Campaign, which contacted her regarding adherence to lisinopril-hydrochlorothiazide.  Wendy Fuller reports that she takes her lisinopril-hydrochlorothiazide 10-12.5 mg once daily as directed. Reports that she occasionally missed a dose because she forgets to take it. Counsel patient on the importance of medication adherence. Reports that she does use a weekly pillbox to organize her medications. Encourage patient to consider also using a daily phone alarm as a reminder. Patient verbalizes understanding.  Note that patient last had this prescription refilled on 05/29/18 for a 90 day supply.   Patient denies any further medication questions/concerns at this time.   Will close pharmacy episode.   Harlow Asa, PharmD, [place] [facility] [phone]

## 2018-08-29 DIAGNOSIS — I10 Essential (primary) hypertension: Secondary | ICD-10-CM | POA: Diagnosis not present

## 2018-08-29 DIAGNOSIS — Z299 Encounter for prophylactic measures, unspecified: Secondary | ICD-10-CM | POA: Diagnosis not present

## 2018-08-29 DIAGNOSIS — E1165 Type 2 diabetes mellitus with hyperglycemia: Secondary | ICD-10-CM | POA: Diagnosis not present

## 2018-08-29 DIAGNOSIS — Z6824 Body mass index (BMI) 24.0-24.9, adult: Secondary | ICD-10-CM | POA: Diagnosis not present

## 2018-08-29 DIAGNOSIS — I739 Peripheral vascular disease, unspecified: Secondary | ICD-10-CM | POA: Diagnosis not present

## 2018-09-11 DIAGNOSIS — Z6831 Body mass index (BMI) 31.0-31.9, adult: Secondary | ICD-10-CM | POA: Diagnosis not present

## 2018-09-11 DIAGNOSIS — M545 Low back pain: Secondary | ICD-10-CM | POA: Diagnosis not present

## 2018-09-11 DIAGNOSIS — I10 Essential (primary) hypertension: Secondary | ICD-10-CM | POA: Diagnosis not present

## 2018-09-11 DIAGNOSIS — M5416 Radiculopathy, lumbar region: Secondary | ICD-10-CM | POA: Diagnosis not present

## 2018-09-27 DIAGNOSIS — R079 Chest pain, unspecified: Secondary | ICD-10-CM | POA: Diagnosis not present

## 2018-09-27 DIAGNOSIS — S0093XA Contusion of unspecified part of head, initial encounter: Secondary | ICD-10-CM | POA: Diagnosis not present

## 2018-09-27 DIAGNOSIS — Z6825 Body mass index (BMI) 25.0-25.9, adult: Secondary | ICD-10-CM | POA: Diagnosis not present

## 2018-09-27 DIAGNOSIS — E1165 Type 2 diabetes mellitus with hyperglycemia: Secondary | ICD-10-CM | POA: Diagnosis not present

## 2018-09-27 DIAGNOSIS — R0789 Other chest pain: Secondary | ICD-10-CM | POA: Diagnosis not present

## 2018-09-27 DIAGNOSIS — I10 Essential (primary) hypertension: Secondary | ICD-10-CM | POA: Diagnosis not present

## 2018-09-27 DIAGNOSIS — R9082 White matter disease, unspecified: Secondary | ICD-10-CM | POA: Diagnosis not present

## 2018-09-27 DIAGNOSIS — Z299 Encounter for prophylactic measures, unspecified: Secondary | ICD-10-CM | POA: Diagnosis not present

## 2018-09-27 DIAGNOSIS — S299XXA Unspecified injury of thorax, initial encounter: Secondary | ICD-10-CM | POA: Diagnosis not present

## 2018-09-27 DIAGNOSIS — G44309 Post-traumatic headache, unspecified, not intractable: Secondary | ICD-10-CM | POA: Diagnosis not present

## 2018-09-27 DIAGNOSIS — S0990XA Unspecified injury of head, initial encounter: Secondary | ICD-10-CM | POA: Diagnosis not present

## 2018-10-03 DIAGNOSIS — E1165 Type 2 diabetes mellitus with hyperglycemia: Secondary | ICD-10-CM | POA: Diagnosis not present

## 2018-10-03 DIAGNOSIS — K2970 Gastritis, unspecified, without bleeding: Secondary | ICD-10-CM | POA: Diagnosis not present

## 2018-10-03 DIAGNOSIS — Z789 Other specified health status: Secondary | ICD-10-CM | POA: Diagnosis not present

## 2018-10-03 DIAGNOSIS — Z6824 Body mass index (BMI) 24.0-24.9, adult: Secondary | ICD-10-CM | POA: Diagnosis not present

## 2018-10-03 DIAGNOSIS — I10 Essential (primary) hypertension: Secondary | ICD-10-CM | POA: Diagnosis not present

## 2018-10-09 DIAGNOSIS — H35372 Puckering of macula, left eye: Secondary | ICD-10-CM | POA: Diagnosis not present

## 2018-10-09 DIAGNOSIS — H524 Presbyopia: Secondary | ICD-10-CM | POA: Diagnosis not present

## 2018-10-18 DIAGNOSIS — R110 Nausea: Secondary | ICD-10-CM | POA: Diagnosis not present

## 2018-10-18 DIAGNOSIS — Z299 Encounter for prophylactic measures, unspecified: Secondary | ICD-10-CM | POA: Diagnosis not present

## 2018-10-18 DIAGNOSIS — I10 Essential (primary) hypertension: Secondary | ICD-10-CM | POA: Diagnosis not present

## 2018-10-18 DIAGNOSIS — R109 Unspecified abdominal pain: Secondary | ICD-10-CM | POA: Diagnosis not present

## 2018-10-18 DIAGNOSIS — Z6822 Body mass index (BMI) 22.0-22.9, adult: Secondary | ICD-10-CM | POA: Diagnosis not present

## 2018-10-25 DIAGNOSIS — J329 Chronic sinusitis, unspecified: Secondary | ICD-10-CM | POA: Diagnosis not present

## 2018-10-25 DIAGNOSIS — R109 Unspecified abdominal pain: Secondary | ICD-10-CM | POA: Diagnosis not present

## 2018-10-25 DIAGNOSIS — I10 Essential (primary) hypertension: Secondary | ICD-10-CM | POA: Diagnosis not present

## 2018-10-25 DIAGNOSIS — Z299 Encounter for prophylactic measures, unspecified: Secondary | ICD-10-CM | POA: Diagnosis not present

## 2018-10-25 DIAGNOSIS — Z6823 Body mass index (BMI) 23.0-23.9, adult: Secondary | ICD-10-CM | POA: Diagnosis not present

## 2018-10-25 DIAGNOSIS — E1165 Type 2 diabetes mellitus with hyperglycemia: Secondary | ICD-10-CM | POA: Diagnosis not present

## 2018-11-10 DIAGNOSIS — Z0000 Encounter for general adult medical examination without abnormal findings: Secondary | ICD-10-CM | POA: Diagnosis not present

## 2018-11-10 DIAGNOSIS — R5383 Other fatigue: Secondary | ICD-10-CM | POA: Diagnosis not present

## 2018-11-10 DIAGNOSIS — Z1339 Encounter for screening examination for other mental health and behavioral disorders: Secondary | ICD-10-CM | POA: Diagnosis not present

## 2018-11-10 DIAGNOSIS — F419 Anxiety disorder, unspecified: Secondary | ICD-10-CM | POA: Diagnosis not present

## 2018-11-10 DIAGNOSIS — Z1331 Encounter for screening for depression: Secondary | ICD-10-CM | POA: Diagnosis not present

## 2018-11-10 DIAGNOSIS — Z1211 Encounter for screening for malignant neoplasm of colon: Secondary | ICD-10-CM | POA: Diagnosis not present

## 2018-11-10 DIAGNOSIS — E1165 Type 2 diabetes mellitus with hyperglycemia: Secondary | ICD-10-CM | POA: Diagnosis not present

## 2018-11-10 DIAGNOSIS — Z299 Encounter for prophylactic measures, unspecified: Secondary | ICD-10-CM | POA: Diagnosis not present

## 2018-11-10 DIAGNOSIS — Z7189 Other specified counseling: Secondary | ICD-10-CM | POA: Diagnosis not present

## 2018-11-10 DIAGNOSIS — Z6822 Body mass index (BMI) 22.0-22.9, adult: Secondary | ICD-10-CM | POA: Diagnosis not present

## 2018-11-10 DIAGNOSIS — I10 Essential (primary) hypertension: Secondary | ICD-10-CM | POA: Diagnosis not present

## 2018-11-16 ENCOUNTER — Encounter (INDEPENDENT_AMBULATORY_CARE_PROVIDER_SITE_OTHER): Payer: Self-pay | Admitting: Internal Medicine

## 2018-11-16 ENCOUNTER — Ambulatory Visit (INDEPENDENT_AMBULATORY_CARE_PROVIDER_SITE_OTHER): Payer: PPO | Admitting: Internal Medicine

## 2018-11-16 ENCOUNTER — Other Ambulatory Visit: Payer: Self-pay

## 2018-11-16 VITALS — BP 94/60 | HR 98 | Temp 97.0°F | Ht 63.5 in | Wt 155.3 lb

## 2018-11-16 DIAGNOSIS — R112 Nausea with vomiting, unspecified: Secondary | ICD-10-CM | POA: Diagnosis not present

## 2018-11-16 MED ORDER — PROMETHAZINE HCL 12.5 MG PO TABS: 12.5000 mg | ORAL_TABLET | Freq: Four times a day (QID) | ORAL | 0 refills | Status: DC | PRN

## 2018-11-16 NOTE — Patient Instructions (Addendum)
Labs, Clear liquid diet.  Clear Liquid Diet, Adult A clear liquid diet is a diet that includes only liquids and semi-liquids that you can see through. You do not eat any food on this diet. Most people need to follow this diet for only a short time. You may need to follow a clear liquid diet if:  You have a problem right before or after you have surgery.  You did not eat food for a long time.  You had any of these: ? Feeling sick to your stomach (nausea). ? Throwing up (vomiting). ? Passing a watery stool (diarrhea).  You are going to have an exam to look at parts of your digestive system.  You are going to have bowel surgery. The goals of this diet are:  To rest the stomach.  To help you clear the digestive system before an exam.  To make sure that there is enough fluid in your body.  To make sure you get some energy.  To help you get back to eating like you used to. What are tips for following this plan?  A clear liquid is a liquid or semi-liquid that you can see through when you hold it up to a light. An example of this is gelatin.  This diet does not give you all the nutrients that you need. Choose a variety of the liquids that your doctor says you can drink on this diet. That way, you will get as many nutrients as possible.  If you are not sure whether you can have certain items, ask your doctor. If you are unable to swallow a thin liquid, you will need to thicken it before taking it. This will stop you from breathing it in (aspiration). What foods should I eat?   Water and flavored water.  Fruit juices that do not have pulp, such as cranberry juice and apple juice.  Tea and coffee without milk or cream.  Clear bouillon or broth.  Broth-based soups that have been strained.  Flavored gelatins.  Honey.  Sugar water.  Ice or frozen ice pops that do not have any milk, yogurt, fruit pieces, or fruit pulp in them.  Clear sodas.  Clear sports drinks. The items  listed above may not be a complete list of what you can eat and drink. Contact a dietitian for more options. What foods should I avoid?  Juices that have pulp.  Milk.  Cream or cream-based soups.  Yogurt.  Normal foods that are not clear liquids or semi-liquids. The items listed above may not be a complete list of what you should not eat and drink. Contact a dietitian for options. Questions to ask your health care provider  How long do I need to follow this diet?  Are there any medicines that I should change while on this diet? Summary  A clear liquid diet is a diet that includes only liquids and semi-liquids that you can see through.  Some goals of this diet are to rest your stomach, make sure you get enough fluid, and give you some energy.  Avoid liquids with milk, cream, or pulp while you are on this diet. This information is not intended to replace advice given to you by your health care provider. Make sure you discuss any questions you have with your health care provider. Document Released: 07/22/2008 Document Revised: 01/30/2018 Document Reviewed: 01/30/2018 Elsevier Interactive Patient Education  2019 [employer].

## 2018-11-16 NOTE — Progress Notes (Signed)
Subjective:    Patient ID: Wendy Fuller, female    DOB: Sep 28, 1940, 78 y.o.   MRN: [id number]  HPI Presents today with c/o nausea and vomiting. No fever or cough.  She says she has not vomited today. Has vomited x 2 weeks. No chills. No change in her BMs. Her appetite is not good. She did keep her cereal down. She has lost 8 pounds since her last visit. There is no abdominal pain. She feels weak.     Review of Systems Past Medical History:  Diagnosis Date  . Back pain   . Diabetes ([place])    x 3 yrs  . Hypertension     Past Surgical History:  Procedure Laterality Date  . ABDOMINAL HYSTERECTOMY    . BALLOON DILATION N/A 12/21/2013   Procedure: BALLOON DILATION;  Surgeon: Rogene Houston, MD;  [facility];  Service: Endoscopy;  Laterality: N/A;  . complete hyster    . ESOPHAGEAL DILATION N/A 07/26/2018   Procedure: ESOPHAGEAL DILATION;  Surgeon: Rogene Houston, MD;  [facility];  Service: Endoscopy;  Laterality: N/A;  . ESOPHAGOGASTRODUODENOSCOPY N/A 12/21/2013   Procedure: ESOPHAGOGASTRODUODENOSCOPY (EGD);  Surgeon: Rogene Houston, MD;  [facility];  Service: Endoscopy;  Laterality: N/A;  730  . ESOPHAGOGASTRODUODENOSCOPY N/A 07/26/2018   Procedure: ESOPHAGOGASTRODUODENOSCOPY (EGD);  Surgeon: Rogene Houston, MD;  [facility];  Service: Endoscopy;  Laterality: N/A;  7:30  . ESOPHAGOGASTRODUODENOSCOPY (EGD) WITH ESOPHAGEAL DILATION N/A 08/01/2013   Procedure: ESOPHAGOGASTRODUODENOSCOPY (EGD) WITH ESOPHAGEAL DILATION;  Surgeon: Rogene Houston, MD;  [facility];  Service: Endoscopy;  Laterality: N/A;  345-moved to 100 [place] notified pt  . MALONEY DILATION N/A 12/21/2013   Procedure: Venia Minks DILATION;  Surgeon: Rogene Houston, MD;  [facility];  Service: Endoscopy;  Laterality: N/A;  . SAVORY DILATION N/A 12/21/2013   Procedure: SAVORY DILATION;  Surgeon: Rogene Houston, MD;  [facility];  Service:  Endoscopy;  Laterality: N/A;  . SHOULDER SURGERY    . THYROID SURGERY      No Known Allergies  Current Outpatient Medications on File Prior to Visit  Medication Sig Dispense Refill  . aspirin EC 81 MG tablet Take 1 tablet (81 mg total) by mouth daily.    Marland Kitchen atorvastatin (LIPITOR) 10 MG tablet Take 10 mg by mouth daily.    . chlordiazePOXIDE-amitriptyline (LIMBITROL DS) 10-25 MG TABS Take 1 tablet by mouth 2 (two) times daily.    . famotidine (PEPCID) 20 MG tablet Take 1 tablet (20 mg total) by mouth 2 (two) times daily. 60 tablet 5  . folic acid (FOLVITE) 182 MCG tablet Take 400 mcg by mouth daily.    Marland Kitchen glimepiride (AMARYL) 4 MG tablet Take 4 mg by mouth daily before breakfast.      . hydrOXYzine (ATARAX/VISTARIL) 10 MG tablet Take 10 mg by mouth 3 (three) times daily as needed.    Marland Kitchen lisinopril-hydrochlorothiazide (PRINZIDE,ZESTORETIC) 10-12.5 MG per tablet Take 1 tablet by mouth daily.      . metolazone (ZAROXOLYN) 5 MG tablet Take 5 mg by mouth 3 (three) times daily.    . Multiple Vitamin (MULTIVITAMIN WITH MINERALS) TABS tablet Take 1 tablet by mouth daily. ALIVE MULTIVITAMIN    . niacin 500 MG tablet Take 500 mg by mouth daily.    Marland Kitchen oxyCODONE-acetaminophen (PERCOCET) 10-325 MG tablet Take 1 tablet by mouth every 4 (four) hours as needed for pain.     Marland Kitchen  pioglitazone (ACTOS) 30 MG tablet Take 30 mg by mouth every evening.   4  . tiZANidine (ZANAFLEX) 4 MG tablet Take 4 mg by mouth 3 (three) times daily as needed for muscle spasms.  2   No current facility-administered medications on file prior to visit.         Objective:   Physical Exam Blood pressure 94/60, pulse 98, temperature (!) 97 F (36.1 C), height 5' 3.5" (1.613 m), weight 155 lb 4.8 oz (70.4 kg).  Alert and oriented. Skin warm and dry. Oral mucosa is moist.   . Sclera anicteric, conjunctivae is pink. Thyroid not enlarged. No cervical lymphadenopathy. Lungs clear. Heart regular rate and rhythm.  Abdomen is soft. Bowel  sounds are positive. No hepatomegaly. No abdominal masses felt. No tenderness.  No edema to lower extremities.         Assessment & Plan:  Nausea and vomiting. Possible viral syndrome.  Am going to get a CMET, CBC and UA.  Clear liquid diet. Further recommendations to follow.  Rx for Phenergan sent to her pharmacy.

## 2018-11-17 LAB — CBC WITH DIFFERENTIAL/PLATELET
Absolute Monocytes: 819 cells/uL (ref 200–950)
Basophils Absolute: 32 cells/uL (ref 0–200)
Basophils Relative: 0.3 %
Eosinophils Absolute: 137 cells/uL (ref 15–500)
Eosinophils Relative: 1.3 %
HCT: 40 % (ref 35.0–45.0)
Hemoglobin: 12.9 g/dL (ref 11.7–15.5)
Lymphs Abs: 3581 cells/uL (ref 850–3900)
MCH: 28.6 pg (ref 27.0–33.0)
MCHC: 32.3 g/dL (ref 32.0–36.0)
MCV: 88.7 fL (ref 80.0–100.0)
MONOS PCT: 7.8 %
MPV: 13.3 fL — ABNORMAL HIGH (ref 7.5–12.5)
NEUTROS ABS: 5933 {cells}/uL (ref 1500–7800)
Neutrophils Relative %: 56.5 %
Platelets: 285 10*3/uL (ref 140–400)
RBC: 4.51 10*6/uL (ref 3.80–5.10)
RDW: 12.7 % (ref 11.0–15.0)
Total Lymphocyte: 34.1 %
WBC: 10.5 10*3/uL (ref 3.8–10.8)

## 2018-11-17 LAB — COMPREHENSIVE METABOLIC PANEL
AG Ratio: 1.2 (calc) (ref 1.0–2.5)
ALT: 11 U/L (ref 6–29)
AST: 10 U/L (ref 10–35)
Albumin: 3.8 g/dL (ref 3.6–5.1)
Alkaline phosphatase (APISO): 62 U/L (ref 37–153)
BUN / CREAT RATIO: 10 (calc) (ref 6–22)
BUN: 29 mg/dL — ABNORMAL HIGH (ref 7–25)
CO2: 31 mmol/L (ref 20–32)
Calcium: 9.7 mg/dL (ref 8.6–10.4)
Chloride: 92 mmol/L — ABNORMAL LOW (ref 98–110)
Creat: 2.97 mg/dL — ABNORMAL HIGH (ref 0.60–0.93)
Globulin: 3.2 g/dL (calc) (ref 1.9–3.7)
Glucose, Bld: 202 mg/dL — ABNORMAL HIGH (ref 65–139)
POTASSIUM: 3.7 mmol/L (ref 3.5–5.3)
Sodium: 135 mmol/L (ref 135–146)
Total Bilirubin: 0.4 mg/dL (ref 0.2–1.2)
Total Protein: 7 g/dL (ref 6.1–8.1)

## 2018-11-17 LAB — URINALYSIS
Bilirubin Urine: NEGATIVE
Hgb urine dipstick: NEGATIVE
Ketones, ur: NEGATIVE
Nitrite: NEGATIVE
PROTEIN: NEGATIVE
Specific Gravity, Urine: 1.018 (ref 1.001–1.03)
pH: <=5 (ref 5.0–8.0)

## 2018-11-22 DIAGNOSIS — M5416 Radiculopathy, lumbar region: Secondary | ICD-10-CM | POA: Diagnosis not present

## 2018-11-22 DIAGNOSIS — J9690 Respiratory failure, unspecified, unspecified whether with hypoxia or hypercapnia: Secondary | ICD-10-CM | POA: Diagnosis not present

## 2018-11-22 DIAGNOSIS — M545 Low back pain: Secondary | ICD-10-CM | POA: Diagnosis not present

## 2018-11-22 DIAGNOSIS — Z4682 Encounter for fitting and adjustment of non-vascular catheter: Secondary | ICD-10-CM | POA: Diagnosis not present

## 2018-11-29 DIAGNOSIS — G4700 Insomnia, unspecified: Secondary | ICD-10-CM | POA: Diagnosis not present

## 2018-11-29 DIAGNOSIS — Z299 Encounter for prophylactic measures, unspecified: Secondary | ICD-10-CM | POA: Diagnosis not present

## 2018-11-29 DIAGNOSIS — Z87891 Personal history of nicotine dependence: Secondary | ICD-10-CM | POA: Diagnosis not present

## 2018-11-29 DIAGNOSIS — Z7901 Long term (current) use of anticoagulants: Secondary | ICD-10-CM | POA: Diagnosis not present

## 2018-11-29 DIAGNOSIS — E119 Type 2 diabetes mellitus without complications: Secondary | ICD-10-CM | POA: Diagnosis not present

## 2018-11-29 DIAGNOSIS — R339 Retention of urine, unspecified: Secondary | ICD-10-CM | POA: Diagnosis not present

## 2018-11-29 DIAGNOSIS — N4883 Acquired buried penis: Secondary | ICD-10-CM | POA: Diagnosis not present

## 2018-11-29 DIAGNOSIS — E1165 Type 2 diabetes mellitus with hyperglycemia: Secondary | ICD-10-CM | POA: Diagnosis not present

## 2018-11-29 DIAGNOSIS — I10 Essential (primary) hypertension: Secondary | ICD-10-CM | POA: Diagnosis not present

## 2018-12-05 DIAGNOSIS — E1165 Type 2 diabetes mellitus with hyperglycemia: Secondary | ICD-10-CM | POA: Diagnosis not present

## 2018-12-05 DIAGNOSIS — Z299 Encounter for prophylactic measures, unspecified: Secondary | ICD-10-CM | POA: Diagnosis not present

## 2018-12-05 DIAGNOSIS — I10 Essential (primary) hypertension: Secondary | ICD-10-CM | POA: Diagnosis not present

## 2018-12-05 DIAGNOSIS — I739 Peripheral vascular disease, unspecified: Secondary | ICD-10-CM | POA: Diagnosis not present

## 2018-12-05 DIAGNOSIS — Z6823 Body mass index (BMI) 23.0-23.9, adult: Secondary | ICD-10-CM | POA: Diagnosis not present

## 2019-01-08 DIAGNOSIS — Z6823 Body mass index (BMI) 23.0-23.9, adult: Secondary | ICD-10-CM | POA: Diagnosis not present

## 2019-01-08 DIAGNOSIS — Z299 Encounter for prophylactic measures, unspecified: Secondary | ICD-10-CM | POA: Diagnosis not present

## 2019-01-08 DIAGNOSIS — R1010 Upper abdominal pain, unspecified: Secondary | ICD-10-CM | POA: Diagnosis not present

## 2019-01-08 DIAGNOSIS — E1165 Type 2 diabetes mellitus with hyperglycemia: Secondary | ICD-10-CM | POA: Diagnosis not present

## 2019-01-08 DIAGNOSIS — I10 Essential (primary) hypertension: Secondary | ICD-10-CM | POA: Diagnosis not present

## 2019-01-08 DIAGNOSIS — I739 Peripheral vascular disease, unspecified: Secondary | ICD-10-CM | POA: Diagnosis not present

## 2019-01-28 ENCOUNTER — Other Ambulatory Visit (INDEPENDENT_AMBULATORY_CARE_PROVIDER_SITE_OTHER): Payer: Self-pay | Admitting: Internal Medicine

## 2019-02-15 DIAGNOSIS — M545 Low back pain: Secondary | ICD-10-CM | POA: Diagnosis not present

## 2019-02-15 DIAGNOSIS — M47816 Spondylosis without myelopathy or radiculopathy, lumbar region: Secondary | ICD-10-CM | POA: Diagnosis not present

## 2019-03-05 DIAGNOSIS — I10 Essential (primary) hypertension: Secondary | ICD-10-CM | POA: Diagnosis not present

## 2019-03-05 DIAGNOSIS — R1011 Right upper quadrant pain: Secondary | ICD-10-CM | POA: Diagnosis not present

## 2019-03-05 DIAGNOSIS — Z6822 Body mass index (BMI) 22.0-22.9, adult: Secondary | ICD-10-CM | POA: Diagnosis not present

## 2019-03-05 DIAGNOSIS — Z299 Encounter for prophylactic measures, unspecified: Secondary | ICD-10-CM | POA: Diagnosis not present

## 2019-03-05 DIAGNOSIS — R110 Nausea: Secondary | ICD-10-CM | POA: Diagnosis not present

## 2019-03-05 DIAGNOSIS — I739 Peripheral vascular disease, unspecified: Secondary | ICD-10-CM | POA: Diagnosis not present

## 2019-03-14 DIAGNOSIS — E1165 Type 2 diabetes mellitus with hyperglycemia: Secondary | ICD-10-CM | POA: Diagnosis not present

## 2019-03-14 DIAGNOSIS — Z299 Encounter for prophylactic measures, unspecified: Secondary | ICD-10-CM | POA: Diagnosis not present

## 2019-03-14 DIAGNOSIS — I739 Peripheral vascular disease, unspecified: Secondary | ICD-10-CM | POA: Diagnosis not present

## 2019-03-14 DIAGNOSIS — Z6822 Body mass index (BMI) 22.0-22.9, adult: Secondary | ICD-10-CM | POA: Diagnosis not present

## 2019-03-14 DIAGNOSIS — R110 Nausea: Secondary | ICD-10-CM | POA: Diagnosis not present

## 2019-03-14 DIAGNOSIS — I10 Essential (primary) hypertension: Secondary | ICD-10-CM | POA: Diagnosis not present

## 2019-03-19 DIAGNOSIS — R1011 Right upper quadrant pain: Secondary | ICD-10-CM | POA: Diagnosis not present

## 2019-03-31 DIAGNOSIS — R110 Nausea: Secondary | ICD-10-CM | POA: Diagnosis not present

## 2019-03-31 DIAGNOSIS — R300 Dysuria: Secondary | ICD-10-CM | POA: Diagnosis not present

## 2019-03-31 DIAGNOSIS — N898 Other specified noninflammatory disorders of vagina: Secondary | ICD-10-CM | POA: Diagnosis not present

## 2019-04-12 DIAGNOSIS — I10 Essential (primary) hypertension: Secondary | ICD-10-CM | POA: Diagnosis not present

## 2019-04-17 DIAGNOSIS — E1165 Type 2 diabetes mellitus with hyperglycemia: Secondary | ICD-10-CM | POA: Diagnosis not present

## 2019-04-26 ENCOUNTER — Other Ambulatory Visit: Payer: Self-pay

## 2019-04-26 ENCOUNTER — Ambulatory Visit (INDEPENDENT_AMBULATORY_CARE_PROVIDER_SITE_OTHER): Payer: PPO | Admitting: Nurse Practitioner

## 2019-04-26 ENCOUNTER — Encounter (INDEPENDENT_AMBULATORY_CARE_PROVIDER_SITE_OTHER): Payer: Self-pay | Admitting: Nurse Practitioner

## 2019-04-26 VITALS — BP 136/79 | HR 88 | Temp 97.5°F | Ht 64.0 in | Wt 146.0 lb

## 2019-04-26 DIAGNOSIS — R634 Abnormal weight loss: Secondary | ICD-10-CM

## 2019-04-26 DIAGNOSIS — R112 Nausea with vomiting, unspecified: Secondary | ICD-10-CM | POA: Diagnosis not present

## 2019-04-26 DIAGNOSIS — R1011 Right upper quadrant pain: Secondary | ICD-10-CM | POA: Diagnosis not present

## 2019-04-26 MED ORDER — ONDANSETRON 4 MG PO TBDP: 4.0000 mg | ORAL_TABLET | Freq: Three times a day (TID) | ORAL | 0 refills | Status: DC | PRN

## 2019-04-26 NOTE — Progress Notes (Signed)
Subjective:    Patient ID: Wendy Fuller, female    DOB: [birth year], 78 y.o.   MRN: [id number]  HPI  Wendy Fuller is a 78 y.o. female who presents today with complaints of persistent nausea. She was last seen in office by Deberah Castle NP on  11/16/2018 with nausea and vomiting x 2 weeks. She also lost 8 lbs over 3 months at that time. Labs were done 11/16/2018 as follows: Na 135. K 3.7. Glu 202. BUN 29 and Cr. 2.97. AST 10. ALT 11. Alk phos 62. WBC 10.5. Hg 12.9. HCT 40. PLT 285. She doe not have any knowledge of having renal disease. She is diabetic and stated she saw a kidney doctor several years ago. She has diabetes on Glimepride and Actos.   She presents today with persistent nausea and vomiting. She has lost a total of 12 lbs since March. Her nausea and vomiting occurs daily then she can go 3 to 4 days without feeling nauseous.  She gets up in the morning, she drinks water and takes her meds. She develops nausea and she vomits "pure water" some mornings. If she eats breakfast, 2 hours later she feels deathly sick and vomits. Her vomiting episodes are sporadic and she has difficulty describing these episodes. She has occasional RUQ pain but mostly feels abdominal soreness. No fever, sweats or chills. No dysuria or hematuria. She uses Zofran 37m ODT which reduces her nausea and she requests a refill of this medication. She complains of having constipation but as long as she takes a stool softener she passes a normal brown stool once daily. No rectal bleeding. She stated having a colonoscopy at least 5 years ago and she doesn't ever want to have another one. She vomited and had diarrhea after taking the bowel prep. She reported completing a Cologuard test by her PCP 1 or 2 years ago that was norma. She underwent an EGD by Dr. RLaural Golden12/11/2017 which showed  Grade A esophagitis. Benign-appearing esophageal stenosis at GEJ. Dilated.  2 cm hiatal hernia. Normal stomach.  Normal duodenal bulb and second portion  of the duodenum. She denies having any GERD symptoms or dysphagia. She is taking Pepecid 250monce daily. She reported having an abdominal sonogram within the past year that showed a normal gallbladder.   Past Medical History:  Diagnosis Date  . Back pain   . Diabetes ([place]   x 3 yrs  . Hypertension    Past Surgical History:  Procedure Laterality Date  . ABDOMINAL HYSTERECTOMY    . BALLOON DILATION N/A 12/21/2013   Procedure: BALLOON DILATION;  Surgeon: NaRogene HoustonMD;  [facility];  Service: Endoscopy;  Laterality: N/A;  . complete hyster    . ESOPHAGEAL DILATION N/A 07/26/2018   Procedure: ESOPHAGEAL DILATION;  Surgeon: ReRogene HoustonMD;  [facility];  Service: Endoscopy;  Laterality: N/A;  . ESOPHAGOGASTRODUODENOSCOPY N/A 12/21/2013   Procedure: ESOPHAGOGASTRODUODENOSCOPY (EGD);  Surgeon: NaRogene HoustonMD;  [facility];  Service: Endoscopy;  Laterality: N/A;  730  . ESOPHAGOGASTRODUODENOSCOPY N/A 07/26/2018   Procedure: ESOPHAGOGASTRODUODENOSCOPY (EGD);  Surgeon: ReRogene HoustonMD;  [facility];  Service: Endoscopy;  Laterality: N/A;  7:30  . ESOPHAGOGASTRODUODENOSCOPY (EGD) WITH ESOPHAGEAL DILATION N/A 08/01/2013   Procedure: ESOPHAGOGASTRODUODENOSCOPY (EGD) WITH ESOPHAGEAL DILATION;  Surgeon: NaRogene HoustonMD;  [facility];  Service: Endoscopy;  Laterality: N/A;  345-moved to 100 [place] notified pt  . MALONEY  DILATION N/A 12/21/2013   Procedure: Venia Minks DILATION;  Surgeon: Rogene Houston, MD;  [facility];  Service: Endoscopy;  Laterality: N/A;  . SAVORY DILATION N/A 12/21/2013   Procedure: SAVORY DILATION;  Surgeon: Rogene Houston, MD;  [facility];  Service: Endoscopy;  Laterality: N/A;  . SHOULDER SURGERY    . THYROID SURGERY     Current Outpatient Medications on File Prior to Visit  Medication Sig Dispense Refill  . aspirin EC 81 MG tablet Take 1 tablet (81 mg total) by mouth daily.     Marland Kitchen atorvastatin (LIPITOR) 10 MG tablet Take 10 mg by mouth daily.    . famotidine (PEPCID) 20 MG tablet TAKE ONE CAPSULE BY MOUTH TWICE DAILY 60 tablet 5  . FARXIGA 10 MG TABS tablet Take 10 mg by mouth daily.    Marland Kitchen glimepiride (AMARYL) 4 MG tablet Take 4 mg by mouth daily before breakfast.      . hydrOXYzine (ATARAX/VISTARIL) 10 MG tablet Take 10 mg by mouth 3 (three) times daily as needed.    Marland Kitchen lisinopril-hydrochlorothiazide (PRINZIDE,ZESTORETIC) 10-12.5 MG per tablet Take 1 tablet by mouth daily.      . Multiple Vitamin (MULTIVITAMIN WITH MINERALS) TABS tablet Take 1 tablet by mouth daily. ALIVE MULTIVITAMIN    . oxyCODONE-acetaminophen (PERCOCET) 10-325 MG tablet Take 1 tablet by mouth every 4 (four) hours as needed for pain.     Marland Kitchen OZEMPIC, 0.25 OR 0.5 MG/DOSE, 2 MG/1.5ML SOPN INJECT ONE PEN ONCE WEEKLY AS DIRECTED    . pioglitazone (ACTOS) 30 MG tablet Take 30 mg by mouth every evening.   4  . promethazine (PHENERGAN) 12.5 MG tablet Take 1 tablet (12.5 mg total) by mouth every 6 (six) hours as needed for nausea or vomiting. 30 tablet 0  . tiZANidine (ZANAFLEX) 4 MG tablet Take 4 mg by mouth 3 (three) times daily as needed for muscle spasms.  2   No current facility-administered medications on file prior to visit.    No Known Allergies  Review of Systems  See HPI, all other systems reviewed and are negative      Objective:   Physical Exam BP 136/79   Pulse 88   Temp (!) 97.5 F (36.4 C) (Oral)   Ht 5' 4"  (1.626 m)   Wt 146 lb (66.2 kg)   BMI 25.06 kg/m    General: 78 y.o. female in NAD Eyes: sclera nonicteric, conjunctiva pink Heart: RRR, no murmur Lungs: clear throughout Abdomen: mild RUQ tenderness, mild generalized tenderness without rebound or guarding, soft, nondistended, + BS x 4 quads Extremities: trace  edema to LEs Neuro: alert and oriented x 4, no focal deficits    Assessment & Plan:   1. 78 y.o, female with nausea and vomiting, generalized abdominal soreness  -check CMP, CBC, CRP and lipase now -abdominal/pelvic CT with oral contrast only, rule out malignant process  -Ondansetron ODT 33m 1 tab po tid PRN -Push fluids  2. 12lb weight loss secondary to N/V -see plan #1  3. Rule out CKD with creatinine 2.97 on 11/16/2018 -she will require nephrology consult if creatinine elevated  -CMP

## 2019-04-26 NOTE — Patient Instructions (Addendum)
1. COMPLETE ORDERED BLOOD TESTS TODAY  2. ABDOMINAL/PELVIC CT SCAN WITH ORAL CONTRAST ONLY  3. ONDANSETRON 4MG  TAB ONE TAB BY MOUTH EVERY 8 HOURS AS NEEDED  4. PUSH FLUIDS  5. FOLLOW UP IN OFFICE IN 2 WEEKS

## 2019-04-27 LAB — COMPLETE METABOLIC PANEL WITH GFR
AG Ratio: 1.5 (calc) (ref 1.0–2.5)
ALT: 11 U/L (ref 6–29)
AST: 12 U/L (ref 10–35)
Albumin: 3.9 g/dL (ref 3.6–5.1)
Alkaline phosphatase (APISO): 48 U/L (ref 37–153)
BUN/Creatinine Ratio: 12 (calc) (ref 6–22)
BUN: 12 mg/dL (ref 7–25)
CO2: 30 mmol/L (ref 20–32)
Calcium: 9.4 mg/dL (ref 8.6–10.4)
Chloride: 100 mmol/L (ref 98–110)
Creat: 0.97 mg/dL — ABNORMAL HIGH (ref 0.60–0.93)
GFR, Est African American: 65 mL/min/{1.73_m2} (ref >=60)
GFR, Est Non African American: 56 mL/min/{1.73_m2} — ABNORMAL LOW (ref >=60)
Globulin: 2.6 g/dL (calc) (ref 1.9–3.7)
Glucose, Bld: 105 mg/dL (ref 65–139)
Potassium: 4.1 mmol/L (ref 3.5–5.3)
Sodium: 139 mmol/L (ref 135–146)
Total Bilirubin: 0.4 mg/dL (ref 0.2–1.2)
Total Protein: 6.5 g/dL (ref 6.1–8.1)

## 2019-04-27 LAB — CBC WITH DIFFERENTIAL/PLATELET
Absolute Monocytes: 540 cells/uL (ref 200–950)
Basophils Absolute: 30 cells/uL (ref 0–200)
Basophils Relative: 0.4 %
Eosinophils Absolute: 59 cells/uL (ref 15–500)
Eosinophils Relative: 0.8 %
HCT: 41.8 % (ref 35.0–45.0)
Hemoglobin: 13 g/dL (ref 11.7–15.5)
Lymphs Abs: 1865 cells/uL (ref 850–3900)
MCH: 28.8 pg (ref 27.0–33.0)
MCHC: 31.1 g/dL — ABNORMAL LOW (ref 32.0–36.0)
MCV: 92.5 fL (ref 80.0–100.0)
MPV: 12.2 fL (ref 7.5–12.5)
Monocytes Relative: 7.3 %
Neutro Abs: 4906 cells/uL (ref 1500–7800)
Neutrophils Relative %: 66.3 %
Platelets: 314 10*3/uL (ref 140–400)
RBC: 4.52 10*6/uL (ref 3.80–5.10)
RDW: 13 % (ref 11.0–15.0)
Total Lymphocyte: 25.2 %
WBC: 7.4 10*3/uL (ref 3.8–10.8)

## 2019-04-27 LAB — C-REACTIVE PROTEIN: CRP: 6.5 mg/L (ref <8.0)

## 2019-04-27 LAB — LIPASE: Lipase: 9 U/L (ref 7–60)

## 2019-05-09 ENCOUNTER — Encounter (INDEPENDENT_AMBULATORY_CARE_PROVIDER_SITE_OTHER): Payer: Self-pay | Admitting: Nurse Practitioner

## 2019-05-09 ENCOUNTER — Encounter (INDEPENDENT_AMBULATORY_CARE_PROVIDER_SITE_OTHER): Payer: Self-pay | Admitting: *Deleted

## 2019-05-09 ENCOUNTER — Other Ambulatory Visit: Payer: Self-pay

## 2019-05-09 ENCOUNTER — Ambulatory Visit (INDEPENDENT_AMBULATORY_CARE_PROVIDER_SITE_OTHER): Payer: PPO | Admitting: Nurse Practitioner

## 2019-05-09 DIAGNOSIS — R1033 Periumbilical pain: Secondary | ICD-10-CM

## 2019-05-09 MED ORDER — ONDANSETRON 4 MG PO TBDP: 4.0000 mg | ORAL_TABLET | Freq: Three times a day (TID) | ORAL | 0 refills | Status: DC | PRN

## 2019-05-09 NOTE — Progress Notes (Signed)
   Subjective:    Patient ID: Wendy Fuller, female    DOB: November 19, 1940, 78 y.o.   MRN: [id number]  HPI Wendy Fuller is a 78 year old female with a past medical history of hypertension, diabetes mellitus type 2 and lower back pain.  She was seen in the office 04/26/2019 with nausea, vomiting and generalized abdominal discomfort.  Laboratory studies 04/26/2019 showed a normal creatinine 0.97.  LFTs were normal.  CRP 6.5. (Cr 2.97 on 11/16/2018).  WBC 7.4.  Hemoglobin 13 and hematocrit 41.8.  An abdominal/pelvic CT with oral contrast was ordered, however, this has not yet been completed.  She presents today for follow-up.  She continues to have nausea with vomiting.  She has vomited once daily for the past 3 days.  She is taking Zofran after she vomits as needed.  Her abdominal discomfort is now periumbilical.  She is passing normal bowel movements, stools are sometimes hard, no rectal bleeding or black stools.  No fever, sweats or chills.  No further weight loss. Father with history of colon cancer diagnosed at the age of 16.  As previously reviewed, she reported having a colonoscopy at least 5 years ago and she doesn't ever want to have another one. She reported completing a Cologuard test 1 or 2 years ago.  I have requested these results.  She prefers to avoid colonoscopy evaluation.  EGD 07/26/2018: Esophagus:  Mucosa of the esophagus appeared to be unremarkable. Schatzki's ring noted at GE junction but not as pronounced as on last exam. GEJ:  30 cm Hiatus:  33 cm Stomach:  Stomach was empty and distended very well with insufflation. Folds in the proximal stomach were normal. Examination of mucosa revealed two small hyperplastic-appearing polyps at gastric body and these were left alone. Antral mucosa was normal. Pyloric channel was patent. Angularis, fundus and cardia were unremarkable. Duodenum:  Normal bulbar and post bulbar mucosa.  Therapeutic/Diagnostic Maneuvers Performed:   Esophagus was dilated by  passing 56 [place] dilator to full insertion. Endoscope was passed again. There was mucosal disruption at GE junction implying disruption of Schatzki's ring. Ring was further disrupted by taking focal biopsy at two sites. Biopsy was also taken from body of esophagus to rule out eosinophilic e     Objective:   Physical Exam  BP 103/70   Pulse 88   Temp 97.7 F (36.5 C) (Oral)   Ht 5\' 4"  (1.626 m)   Wt 147 lb 1.6 oz (66.7 kg)   BMI 25.25 kg/m  General: 78 year old female no acute distress Eyes: Sclera nonicteric, conjunctiva pink Heart: Regular rate and rhythm, no murmurs Lungs: Breath sounds clear throughout Abdomen: Soft, mild periumbilical tenderness without rebound or guarding, positive bowel sounds to all 4 quadrants no HSM Extremities: No edema Neuro: Alert and oriented x4, no focal deficits     Assessment & Plan:   63.  78 year old female with nausea and vomiting with periumbilical abdominal pain -Proceed with abdominal/pelvic CT scheduled 05/14/2019 -Patient to present to the local emergency room if she develops worsening abdominal pain or increased vomiting -Zofran 4 mg tab 1 p.o. twice daily for the next 7 days then as needed -Push fluids, 3-4 snacks sized meals daily  2.  Weight loss, no further weight loss since last office visit 04/26/2019 -Further follow-up to be determined after CT results reviewed

## 2019-05-09 NOTE — Patient Instructions (Signed)
1. Proceed with your abdominal/pelvic CT as scheduled   2. Take Zofran 4mg  tab one tab 30 minutes before breakfast and 30 minutes before dinner for the next 7 days then as needed  3. Please call your primary doctor's office and obtain a copy of your Cologuard Test   4. Further follow up to be determined after your abdominal/pelvic CT results have been reviewed

## 2019-05-10 DIAGNOSIS — I10 Essential (primary) hypertension: Secondary | ICD-10-CM | POA: Diagnosis not present

## 2019-05-10 DIAGNOSIS — R1033 Periumbilical pain: Secondary | ICD-10-CM | POA: Insufficient documentation

## 2019-05-14 ENCOUNTER — Ambulatory Visit (HOSPITAL_COMMUNITY)
Admission: RE | Admit: 2019-05-14 | Discharge: 2019-05-14 | Disposition: A | Discharge status: Home / Self Care | Payer: PPO | Source: Ambulatory Visit | Attending: Nurse Practitioner | Admitting: Nurse Practitioner

## 2019-05-14 ENCOUNTER — Other Ambulatory Visit: Payer: Self-pay

## 2019-05-14 ENCOUNTER — Telehealth (INDEPENDENT_AMBULATORY_CARE_PROVIDER_SITE_OTHER): Payer: Self-pay | Admitting: *Deleted

## 2019-05-14 DIAGNOSIS — R1011 Right upper quadrant pain: Secondary | ICD-10-CM | POA: Diagnosis not present

## 2019-05-14 DIAGNOSIS — K5730 Diverticulosis of large intestine without perforation or abscess without bleeding: Secondary | ICD-10-CM | POA: Diagnosis not present

## 2019-05-14 DIAGNOSIS — R634 Abnormal weight loss: Secondary | ICD-10-CM | POA: Diagnosis not present

## 2019-05-14 DIAGNOSIS — R112 Nausea with vomiting, unspecified: Secondary | ICD-10-CM | POA: Diagnosis not present

## 2019-05-14 NOTE — Telephone Encounter (Signed)
Patient called and left message that her stool was positive for blood that she had checked at her PCP

## 2019-05-15 NOTE — Telephone Encounter (Signed)
I called patient, she is still having n/v and central abd pain. She is now willing to schedule an EGD and colonoscopy because she wants to know what is wrong. Abd/pelvic CT did not explain her sx. I will have an call patient tomorrow to schedule egd/colonoscopy

## 2019-05-16 DIAGNOSIS — E1165 Type 2 diabetes mellitus with hyperglycemia: Secondary | ICD-10-CM | POA: Diagnosis not present

## 2019-05-17 ENCOUNTER — Other Ambulatory Visit (INDEPENDENT_AMBULATORY_CARE_PROVIDER_SITE_OTHER): Payer: Self-pay | Admitting: Nurse Practitioner

## 2019-05-17 DIAGNOSIS — R634 Abnormal weight loss: Secondary | ICD-10-CM

## 2019-05-17 DIAGNOSIS — R195 Other fecal abnormalities: Secondary | ICD-10-CM

## 2019-05-17 DIAGNOSIS — R1033 Periumbilical pain: Secondary | ICD-10-CM

## 2019-05-23 ENCOUNTER — Telehealth (INDEPENDENT_AMBULATORY_CARE_PROVIDER_SITE_OTHER): Payer: Self-pay | Admitting: *Deleted

## 2019-05-23 ENCOUNTER — Encounter (INDEPENDENT_AMBULATORY_CARE_PROVIDER_SITE_OTHER): Payer: Self-pay | Admitting: *Deleted

## 2019-05-23 ENCOUNTER — Other Ambulatory Visit (INDEPENDENT_AMBULATORY_CARE_PROVIDER_SITE_OTHER): Payer: Self-pay | Admitting: Nurse Practitioner

## 2019-05-23 DIAGNOSIS — R1033 Periumbilical pain: Secondary | ICD-10-CM

## 2019-05-23 DIAGNOSIS — R634 Abnormal weight loss: Secondary | ICD-10-CM

## 2019-05-23 DIAGNOSIS — R112 Nausea with vomiting, unspecified: Secondary | ICD-10-CM

## 2019-05-23 DIAGNOSIS — R195 Other fecal abnormalities: Secondary | ICD-10-CM

## 2019-05-23 DIAGNOSIS — R1110 Vomiting, unspecified: Secondary | ICD-10-CM | POA: Insufficient documentation

## 2019-05-23 MED ORDER — PEG 3350-KCL-NA BICARB-NACL 420 G PO SOLR: 4000.0000 mL | Freq: Once | ORAL | 0 refills | Status: AC

## 2019-05-23 NOTE — Telephone Encounter (Signed)
Patient needs trilyte TCS/EGD sch'd 11/5

## 2019-05-24 DIAGNOSIS — M5416 Radiculopathy, lumbar region: Secondary | ICD-10-CM | POA: Diagnosis not present

## 2019-05-24 DIAGNOSIS — Z6827 Body mass index (BMI) 27.0-27.9, adult: Secondary | ICD-10-CM | POA: Diagnosis not present

## 2019-05-24 DIAGNOSIS — M48061 Spinal stenosis, lumbar region without neurogenic claudication: Secondary | ICD-10-CM | POA: Diagnosis not present

## 2019-06-06 ENCOUNTER — Ambulatory Visit (INDEPENDENT_AMBULATORY_CARE_PROVIDER_SITE_OTHER): Payer: PPO | Admitting: Family Medicine

## 2019-06-06 ENCOUNTER — Telehealth: Payer: Self-pay

## 2019-06-06 ENCOUNTER — Encounter: Payer: Self-pay | Admitting: Family Medicine

## 2019-06-06 ENCOUNTER — Other Ambulatory Visit: Payer: Self-pay

## 2019-06-06 VITALS — BP 140/68 | HR 82 | Temp 97.9°F | Ht 63.0 in | Wt 145.8 lb

## 2019-06-06 DIAGNOSIS — F419 Anxiety disorder, unspecified: Secondary | ICD-10-CM | POA: Diagnosis not present

## 2019-06-06 DIAGNOSIS — R112 Nausea with vomiting, unspecified: Secondary | ICD-10-CM | POA: Diagnosis not present

## 2019-06-06 DIAGNOSIS — E119 Type 2 diabetes mellitus without complications: Secondary | ICD-10-CM

## 2019-06-06 DIAGNOSIS — R300 Dysuria: Secondary | ICD-10-CM | POA: Diagnosis not present

## 2019-06-06 DIAGNOSIS — R195 Other fecal abnormalities: Secondary | ICD-10-CM | POA: Diagnosis not present

## 2019-06-06 DIAGNOSIS — R634 Abnormal weight loss: Secondary | ICD-10-CM

## 2019-06-06 DIAGNOSIS — I10 Essential (primary) hypertension: Secondary | ICD-10-CM

## 2019-06-06 DIAGNOSIS — E785 Hyperlipidemia, unspecified: Secondary | ICD-10-CM | POA: Insufficient documentation

## 2019-06-06 LAB — POCT URINALYSIS DIP (CLINITEK)
Bilirubin, UA: NEGATIVE
Blood, UA: NEGATIVE
Glucose, UA: 500 mg/dL — AB
Ketones, POC UA: NEGATIVE mg/dL
Nitrite, UA: NEGATIVE
POC PROTEIN,UA: NEGATIVE
Spec Grav, UA: 1.015 (ref 1.010–1.025)
Urobilinogen, UA: 0.2 E.U./dL
pH, UA: 6 (ref 5.0–8.0)

## 2019-06-06 LAB — POCT UA - MICROALBUMIN
A1c: <30
Creatinine, POC: 100 mg/dL
Microalbumin Ur, POC: 10 mg/L

## 2019-06-06 LAB — POCT GLYCOSYLATED HEMOGLOBIN (HGB A1C): Hemoglobin A1C: 7 % — AB (ref 4.0–5.6)

## 2019-06-06 NOTE — Progress Notes (Signed)
New Patient Office Visit  Subjective:  Patient ID: Wendy Fuller, female    DOB: 05/02/1941  Age: 78 y.o. MRN: [id number]  CC:  Chief Complaint  Patient presents with  . Establish Care  . Abdominal Pain  . Insomnia   05/24/2019  1   05/24/2019  Oxycodone-Acetaminophen 10-325  120.00  30 Ju Bre   [id number]   Ede (0252)   0  60.00 MME  Comm Ins   [place]  05/17/2019  1   03/23/2019  Chlordiazepox-Amitriptyl 10-25  60.00  30 Dh Vya   [id number]   Ede (0252)   2  2.00 LME  Comm Ins   [place]  04/17/2019  1   02/15/2019  Oxycodone-Acetaminophen 10-325  120.00  30 Ju Bre   [id number]   Ede (0252)   0  60.00 MME  Comm Ins   [place]  04/17/2019  1   03/23/2019  Chlordiazepox-Amitriptyl 10-25  60.00  30 Dh Vya   [id number]   Ede (0252)   1  2.00 LME  Comm Ins   [place]  03/23/2019  1   02/15/2019  Oxycodone-Acetaminophen 10-325  120.00  30 Ju Bre   [id number]   Ede (0252)   0  60.00 MME  Comm Ins   [place]  03/23/2019  1   03/23/2019  Chlordiazepox-Amitriptyl 10-25  60.00  30 Dh Vya   [id number]   Ede (0252)   0  2.00 LME  Comm Ins   [place]  02/15/2019  1   02/15/2019  Oxycodone-Acetaminophen 10-325  120.00  30 Ju Bre   [id number]   Ede (0252)   0  60.00 MME  Comm Ins   [place]  02/15/2019  1   08/29/2018  Chlordiazepox-Amitriptyl 10-25  60.00  30 Dh Vya   [id number]   Ede (0252)   0  2.00 LME  Comm Ins   [place]  01/12/2019  1   11/10/2018  Chlordiazepox-Amitriptyl 10-25  60.00  30 Ma Bar   [id number]   Ede [phone]   2  2.00 LME  Comm Ins   [place]  01/12/2019  1   11/22/2018  Oxycodone-Acetaminophen 10-325  120.00  30 Sa Des   [id number]   Ede ([id number])   0  60.00 MME  Comm Ins   [place]  12/15/2018  1   11/10/2018  Chlordiazepox-Amitriptyl 10-25  60.00  30 Ma Bar   [id number]   Ede [phone])   1  2.00 LME  Comm Ins   [place]  12/02/2018  1   11/22/2018  Oxycodone-Acetaminophen 10-325  120.00  30 Sa Des   [id number]   Ede (0252)   0  60.00 MME  Comm Ins   [place]  11/10/2018  1   11/10/2018  Chlordiazepox-Amitriptyl 10-25  60.00  30 Ma Bar   [id number]   Ede (0252)   0  2.00 LME  Comm Ins   [place]   11/03/2018  1   09/11/2018  Oxycodone-Acetaminophen 10-325  120.00  30 Sa Des   [id number]   Ede (0252)   0  60.00 MME  Comm Ins     09/27/2018  1   04/20/2018  Chlordiazepoxide 10 MG Capsule  60.00  30 Dh Vya   [id number]   Ede (0252)   0  0.80 LME  Comm Ins     09/26/2018  1   09/11/2018  Oxycodone-Acetaminophen 10-325  120.00  74 Sa Des   [id number]   Ede ([id number])   0  60.00 MME  Comm Ins   [place]  08/29/2018  1   05/12/2018  Chlordiazepox-Amitriptyl 10-25  57.00  29 Dh Vya   [id number]   Ede (0252)   1  1.97 LME  Private Pay   [place]  08/24/2018  1   06/26/2018  Oxycodone-Acetaminophen 10-325  120.00  30 Sa Des   [id number]   Ede (0252)   0  60.00 MME  Comm Ins   [place]  07/25/2018  1   06/26/2018  Oxycodone-Acetaminophen 10-325  120.00  30 Sa Des   [id number]   Ede (0252)   0  60.00 MME  Comm Ins   [place]  06/26/2018  1   06/26/2018  Oxycodone-Acetaminophen 10-325  120.00  30 Sa Des   [id number]   Ede (0252)   0  60.00 MME  Comm Ins   [place]  06/21/2018  1   06/21/2018  Oxycodone-Acetaminophen 5-325  15.00  5 Ma Bar   [id number]   Ede (0252)   0  22.50 MME  Comm Ins   [place]  06/12/2018  1   04/05/2018  Oxycodone-Acetaminophen 5-325  30.00  8 Sa Des   [id number]   Ede ([id number])   0  28.12 MME  Comm Ins   [place]  06/09/2018  1   05/12/2018  Chlordiazepox-Amitriptyl 10-25  60.00  30 Dh Vya   [id number]   Ede (0252)   0  2.00 LME  Private Pay   [place]  05/16/2018  1   05/16/2018  Hydrocodone-Homatropine Syrup  100.00  10 Dh Vya   [id number]   Ede (0252)   0  10.00 MME  Comm Ins   [place]  05/12/2018  1   03/02/2018  Chlordiazepox-Amitriptyl 10-25  60.00  30 Dh Vya   [id number]   Ede (0252)   2  2.00 LME  Comm Ins   [place]  05/05/2018  1   04/05/2018  Oxycodone-Acetaminophen 10-325  120.00  30 Sa Des   [id number]   Ede (0252)   0  60.00 MME  Comm Ins   [place]  04/20/2018  1   04/20/2018  Hydrocodone-Homatropine Syrup  100.00  10 Dh Vya   [id number]   Ede (0252)   0  10.00 MME  Comm Ins   [place]  04/05/2018  1   03/02/2018  Chlordiazepox-Amitriptyl 10-25  60.00  30 Dh Vya   [id number]   Ede  (0252)   1  2.00 LME  Comm Ins   [place]  04/05/2018  1   04/05/2018  Oxycodone-Acetaminophen 10-325  120.00  30 Sa Des   [id number]   Ede (0252)   0  60.00 MME  Comm Ins   [place]  03/02/2018  1   03/02/2018  Chlordiazepox-Amitriptyl 10-25  60.00  30 Dh Vya   [id number]   Ede (0252)   0  2.00 LME  Comm Ins   [place]  02/19/2018  1   01/04/2018  Oxycodon-Acetaminophen 7.5-325  120.00  30 Sa Des   [id number]   Ede (0252)   0  45.00 MME  Comm Ins   [place]  01/24/2018  1   11/09/2017  Chlordiazepox-Amitriptyl 10-25  60.00  30 Dh Vya   [id number]   Ede (0252)   2  2.00 LME  Comm Ins   [place]  01/20/2018  1   01/04/2018  Oxycodon-Acetaminophen 7.5-325  120.00  30 Sa Des   [id number]   Ede ([id number])   0  45.00 MME  Comm  Ins   [place]  12/22/2017  1   10/17/2017  Oxycodon-Acetaminophen 7.5-325  120.00  30 Sa Des   [id number]   Ede (0252)   0  45.00 MME  Comm Ins   [place]  12/22/2017  1   11/09/2017  Chlordiazepox-Amitriptyl 10-25  60.00  30 Dh Vya   [id number]   Ede (0252)   1  2.00 LME  Comm Ins   [place]  11/09/2017  1   11/09/2017  Chlordiazepox-Amitriptyl 10-25  60.00  30 Dh Vya   [id number]   Ede (0252)   0  2.00 LME  Comm Ins   [place]  10/25/2017  1   10/17/2017  Oxycodon-Acetaminophen 7.5-325  120.00  30 Sa Des   [id number]   Ede ([id number])   0  45.00 MME  Medicare   [place]  09/23/2017  1   04/12/2017  Chlordiazepox-Amitriptyl 10-25  60.00  30 Dh Vya   [id number]   Ede ([id number])   1  2.00 LME  Comm Ins   [place]  09/23/2017  1   07/25/2017  Oxycodon-Acetaminophen 7.5-325  120.00  30 Sa Des   [id number]   Ede ([id number])   0  45.00 MME  Medicare   [place]  08/24/2017  1   07/25/2017  Oxycodon-Acetaminophen 7.5-325  120.00  30 Sa Des   [id number]   Ede ([id number])   0  45.00 MME  Medicare   [place]  07/25/2017  1   07/25/2017  Oxycodon-Acetaminophen 7.5-325  120.00  30 Sa Des   [id number]   Ede ([id number])   0  45.00 MME  Medicare   [place]  07/18/2017  1   04/12/2017  Chlordiazepox-Amitriptyl 10-25  60.00  30 Dh Vya   [id number]   Ede (0252)   0  2.00 LME  Other   [place]  07/11/2017  1   04/27/2017  Hydrocodone-Acetamin 10-325 MG   120.00  30 Sa Des   [id number]   Ede ([id number])   0  40.00 MME  Medicare   [place]  06/25/2017  1   06/25/2017  Oxycodone-Acetaminophen 5-325  10.00  3 Jo Cav   [id number]   Ede (0252)   0  25.00 MME  Medicare     06/08/2017  1   04/27/2017  Hydrocodone-Acetamin 10-325 MG  120.00  30 Sa Des   [id number]   Ede ([id number])   0  40.00 MME        HPI MARIKA BELLS presents for DM-Ozempic -new start 3 months ago-antibiotics taken as a result of infection Amaryl daily  Actos daily [place] started 1 month ago-pt with infection-unsure if she wants to continue this medication Used monostat for irritation external last week with some relief  Abdominal pain and +blood in stool-seeing GI for evaluation-given antibiotics for abdominal pain CT normal, colonoscopy scheduled. EGD completed 12/19-esophageal dilation  168-145-weight loss from March to Sept. Pt takes zofran prn for nausea-last dose Monday  HTN-zestoretic daily-ecg last completed -10/19  Thyroid disease-"spot" on thyroid removed(2012)-calcium buildup from steroids taken, no malignancy  Back pain and "pinched nerve" after a fall-steroids shot-zanaflex in the past-pt takes Valium  Hyperlipidemia-atorvastatin  Abnormal renal function-sees nephro yearly  Anxiety-amitriptyline +Valium-long term medication-pt states she tried to come off for 2 months in the past with extreme withdrawal. Pt is not being followed by psy Past Medical History:  Diagnosis Date  . Arthritis   . Back pain   . Cataract   . Depression   . Diabetes ([place])  x 3 yrs  . Hypertension   . Thyroid disease     Past Surgical History:  Procedure Laterality Date  . ABDOMINAL HYSTERECTOMY    . BALLOON DILATION N/A 12/21/2013   Procedure: BALLOON DILATION;  Surgeon: Rogene Houston, MD;  [facility];  Service: Endoscopy;  Laterality: N/A;  . CESAREAN SECTION    . complete hyster    . ESOPHAGEAL DILATION N/A 07/26/2018   Procedure: ESOPHAGEAL DILATION;  Surgeon: Rogene Houston,  MD;  [facility];  Service: Endoscopy;  Laterality: N/A;  . ESOPHAGOGASTRODUODENOSCOPY N/A 12/21/2013   Procedure: ESOPHAGOGASTRODUODENOSCOPY (EGD);  Surgeon: Rogene Houston, MD;  [facility];  Service: Endoscopy;  Laterality: N/A;  730  . ESOPHAGOGASTRODUODENOSCOPY N/A 07/26/2018   Procedure: ESOPHAGOGASTRODUODENOSCOPY (EGD);  Surgeon: Rogene Houston, MD;  [facility];  Service: Endoscopy;  Laterality: N/A;  7:30  . ESOPHAGOGASTRODUODENOSCOPY (EGD) WITH ESOPHAGEAL DILATION N/A 08/01/2013   Procedure: ESOPHAGOGASTRODUODENOSCOPY (EGD) WITH ESOPHAGEAL DILATION;  Surgeon: Rogene Houston, MD;  [facility];  Service: Endoscopy;  Laterality: N/A;  345-moved to 100 [place] notified pt  . EYE SURGERY    . MALONEY DILATION N/A 12/21/2013   Procedure: Venia Minks DILATION;  Surgeon: Rogene Houston, MD;  [facility];  Service: Endoscopy;  Laterality: N/A;  . SAVORY DILATION N/A 12/21/2013   Procedure: SAVORY DILATION;  Surgeon: Rogene Houston, MD;  [facility];  Service: Endoscopy;  Laterality: N/A;  . SHOULDER SURGERY    . THYROID SURGERY      Family History  Problem Relation Age of Onset  . Cancer Mother   . Diabetes Mother   . Hyperlipidemia Mother   . Cancer Father     Social History   Socioeconomic History  . Marital status: Married    Spouse name: Not on file  . Number of children: Not on file  . Years of education: Not on file  . Highest education level: Not on file  Occupational History  . Occupation: retired  Scientific laboratory technician  . Financial resource strain: Not on file  . Food insecurity    Worry: Not on file    Inability: Not on file  . Transportation needs    Medical: Not on file    Non-medical: Not on file  Tobacco Use  . Smoking status: Never Smoker  . Smokeless tobacco: Never Used  Substance and Sexual Activity  . Alcohol use: No  . Drug use: No  . Sexual activity: Not Currently  Lifestyle  . Physical  activity    Days per week: Not on file    Minutes per session: Not on file  . Stress: Not on file  Relationships  . Social Herbalist on phone: Not on file    Gets together: Not on file    Attends religious service: Not on file    Active member of club or organization: Not on file    Attends meetings of clubs or organizations: Not on file    Relationship status: Not on file  . Intimate partner violence    Fear of current or ex partner: Not on file    Emotionally abused: Not on file    Physically abused: Not on file    Forced sexual activity: Not on file  Other Topics Concern  . Not on file  Social History Narrative  . Not on file    ROS Review of Systems  Constitutional: Positive for appetite change, diaphoresis and unexpected weight change.  HENT: Positive for trouble swallowing.   Eyes: Positive for itching.  Respiratory: Positive for cough, choking and wheezing.   Gastrointestinal: Positive for blood in stool, constipation, nausea and vomiting.  Endocrine:       DM  Musculoskeletal: Positive for arthralgias, back pain and myalgias.  Neurological: Positive for dizziness and weakness.  Psychiatric/Behavioral: Positive for sleep disturbance. The patient is nervous/anxious.        Depression-    Objective:   Today's Vitals: BP 140/68 (BP Location: Right Arm, Patient Position: Sitting, Cuff Size: Normal)   Pulse 82   Temp 97.9 F (36.6 C) (Oral)   Ht 5\' 3"  (1.6 m)   Wt 145 lb 12.8 oz (66.1 kg)   SpO2 96%   BMI 25.83 kg/m   Physical Exam Constitutional:      Appearance: She is well-developed.  HENT:     Head: Normocephalic and atraumatic.  Cardiovascular:     Rate and Rhythm: Normal rate and regular rhythm.     Heart sounds: Normal heart sounds.  Pulmonary:     Effort: Pulmonary effort is normal.     Breath sounds: Normal breath sounds.  Genitourinary:    Comments: Recent infection-yeast Neurological:     Mental Status: She is alert and oriented  to person, place, and time.  Psychiatric:        Mood and Affect: Mood normal.        Behavior: Behavior normal.     Assessment & Plan:    Outpatient Encounter Medications as of 06/06/2019  Medication Sig  . aspirin EC 81 MG tablet Take 1 tablet (81 mg total) by mouth daily.  Marland Kitchen atorvastatin (LIPITOR) 10 MG tablet Take 10 mg by mouth daily.  . famotidine (PEPCID) 20 MG tablet TAKE ONE CAPSULE BY MOUTH TWICE DAILY  . FARXIGA 10 MG TABS tablet Take 10 mg by mouth daily.  Marland Kitchen glimepiride (AMARYL) 4 MG tablet Take 4 mg by mouth daily before breakfast.    . lisinopril-hydrochlorothiazide (PRINZIDE,ZESTORETIC) 10-12.5 MG per tablet Take 1 tablet by mouth daily.    . Multiple Vitamin (MULTIVITAMIN WITH MINERALS) TABS tablet Take 1 tablet by mouth daily. ALIVE MULTIVITAMIN  . ondansetron (ZOFRAN ODT) 4 MG disintegrating tablet Take 1 tablet (4 mg total) by mouth every 8 (eight) hours as needed for nausea or vomiting. (Patient not taking: Reported on 05/09/2019)  . ondansetron (ZOFRAN ODT) 4 MG disintegrating tablet Take 1 tablet (4 mg total) by mouth every 8 (eight) hours as needed for nausea or vomiting.  Marland Kitchen OZEMPIC, 0.25 OR 0.5 MG/DOSE, 2 MG/1.5ML SOPN INJECT ONE PEN ONCE WEEKLY AS DIRECTED  . pioglitazone (ACTOS) 30 MG tablet Take 30 mg by mouth every evening.   Marland Kitchen tiZANidine (ZANAFLEX) 4 MG tablet Take 4 mg by mouth 3 (three) times daily as needed for muscle spasms.  . [DISCONTINUED] hydrOXYzine (ATARAX/VISTARIL) 10 MG tablet Take 10 mg by mouth 3 (three) times daily as needed.  . [DISCONTINUED] oxyCODONE-acetaminophen (PERCOCET) 10-325 MG tablet Take 1 tablet by mouth every 4 (four) hours as needed for pain.   . [DISCONTINUED] promethazine (PHENERGAN) 12.5 MG tablet Take 1 tablet (12.5 mg total) by mouth every 6 (six) hours as needed for nausea or vomiting. (Patient not taking: Reported on 06/06/2019)   No facility-administered encounter medications on file as of 06/06/2019.    1.  Hyperlipidemia, unspecified hyperlipidemia type Atorvastatin no recent evaluation - Lipid panel  2. Nausea  and vomiting, intractability of vomiting not specified, unspecified vomiting type GI following--scheduled for colonoscopy, CT no concerns  3. Type 2 diabetes mellitus without complication, without long-term current use of insulin (HCC) Stop Farxiga-h/o of infection Continue Actos, Amaryl, Ozempic A1c 7.0% Eye exam requested - Microalbumin, urine-normal U/a -concern for infection-glucose 500(on Farxiga)-trace leukocytes-culture pending 4. Heme + stool GI following, weight loss-+FH colon cancer  5. Abnormal weight loss GI following, pt started  Ozempic 2 months ago-weight loss over the last 6 months  6. Essential hypertension, benign - TSH  7. Anxiety Valium/amtriplyine Follow-up:  DISCUSSED WITH PT CAN NOT WRITE FOR MEDICATION-VALIUM-advised pt to return to prior physician as he has written for medication long term for pt DISCUSSED WITH PT CAN NOT WRITE FOR MEDICATION-OXYCODONE-advised pt to return to prior physician as he has written for medication long term for pt  Pt understands risk of medication and concern for use of both medications along with current symptoms and additional medication  Hannah Beat, MD

## 2019-06-06 NOTE — Patient Instructions (Addendum)
labwork fasting Urine-today microalb/ Recommendation to have referral into behavioral health

## 2019-06-06 NOTE — Telephone Encounter (Signed)
Wendy Fuller, CMA  

## 2019-06-08 LAB — URINE CULTURE
MICRO NUMBER:: 989278
SPECIMEN QUALITY:: ADEQUATE

## 2019-06-10 ENCOUNTER — Other Ambulatory Visit: Payer: Self-pay | Admitting: Family Medicine

## 2019-06-10 MED ORDER — AMOXICILLIN 250 MG PO CAPS: 250.0000 mg | ORAL_CAPSULE | Freq: Two times a day (BID) | ORAL | 0 refills | Status: DC

## 2019-06-14 DIAGNOSIS — I10 Essential (primary) hypertension: Secondary | ICD-10-CM | POA: Diagnosis not present

## 2019-06-14 DIAGNOSIS — E1165 Type 2 diabetes mellitus with hyperglycemia: Secondary | ICD-10-CM | POA: Diagnosis not present

## 2019-06-20 ENCOUNTER — Ambulatory Visit (INDEPENDENT_AMBULATORY_CARE_PROVIDER_SITE_OTHER): Payer: PPO | Admitting: Nurse Practitioner

## 2019-06-22 DIAGNOSIS — R110 Nausea: Secondary | ICD-10-CM | POA: Diagnosis not present

## 2019-06-22 DIAGNOSIS — E7800 Pure hypercholesterolemia, unspecified: Secondary | ICD-10-CM | POA: Diagnosis not present

## 2019-06-22 DIAGNOSIS — I10 Essential (primary) hypertension: Secondary | ICD-10-CM | POA: Diagnosis not present

## 2019-06-22 DIAGNOSIS — Z299 Encounter for prophylactic measures, unspecified: Secondary | ICD-10-CM | POA: Diagnosis not present

## 2019-06-22 DIAGNOSIS — I739 Peripheral vascular disease, unspecified: Secondary | ICD-10-CM | POA: Diagnosis not present

## 2019-06-22 DIAGNOSIS — Z6821 Body mass index (BMI) 21.0-21.9, adult: Secondary | ICD-10-CM | POA: Diagnosis not present

## 2019-06-22 DIAGNOSIS — E1165 Type 2 diabetes mellitus with hyperglycemia: Secondary | ICD-10-CM | POA: Diagnosis not present

## 2019-06-26 ENCOUNTER — Other Ambulatory Visit (HOSPITAL_COMMUNITY)
Admission: RE | Admit: 2019-06-26 | Discharge: 2019-06-26 | Disposition: A | Discharge status: Home / Self Care | Payer: PPO | Source: Ambulatory Visit | Attending: Internal Medicine | Admitting: Internal Medicine

## 2019-06-26 ENCOUNTER — Other Ambulatory Visit: Payer: Self-pay

## 2019-06-26 DIAGNOSIS — Z01812 Encounter for preprocedural laboratory examination: Secondary | ICD-10-CM | POA: Insufficient documentation

## 2019-06-26 DIAGNOSIS — Z20828 Contact with and (suspected) exposure to other viral communicable diseases: Secondary | ICD-10-CM | POA: Insufficient documentation

## 2019-06-26 LAB — SARS CORONAVIRUS 2 (TAT 6-24 HRS): SARS Coronavirus 2: NEGATIVE

## 2019-06-28 ENCOUNTER — Encounter (HOSPITAL_COMMUNITY): Payer: Self-pay | Admitting: *Deleted

## 2019-06-28 ENCOUNTER — Ambulatory Visit (HOSPITAL_COMMUNITY)
Admission: RE | Admit: 2019-06-28 | Discharge: 2019-06-28 | Disposition: A | Discharge status: Home / Self Care | Payer: PPO | Attending: Internal Medicine | Admitting: Internal Medicine

## 2019-06-28 ENCOUNTER — Encounter (HOSPITAL_COMMUNITY)
Admission: RE | Disposition: A | Discharge status: Home / Self Care | Payer: Self-pay | Source: Home / Self Care | Attending: Internal Medicine

## 2019-06-28 DIAGNOSIS — Z7984 Long term (current) use of oral hypoglycemic drugs: Secondary | ICD-10-CM | POA: Diagnosis not present

## 2019-06-28 DIAGNOSIS — D125 Benign neoplasm of sigmoid colon: Secondary | ICD-10-CM | POA: Diagnosis not present

## 2019-06-28 DIAGNOSIS — Z801 Family history of malignant neoplasm of trachea, bronchus and lung: Secondary | ICD-10-CM | POA: Diagnosis not present

## 2019-06-28 DIAGNOSIS — Z800 Family history of malignant neoplasm of digestive organs: Secondary | ICD-10-CM | POA: Insufficient documentation

## 2019-06-28 DIAGNOSIS — K219 Gastro-esophageal reflux disease without esophagitis: Secondary | ICD-10-CM | POA: Insufficient documentation

## 2019-06-28 DIAGNOSIS — I10 Essential (primary) hypertension: Secondary | ICD-10-CM | POA: Diagnosis not present

## 2019-06-28 DIAGNOSIS — R634 Abnormal weight loss: Secondary | ICD-10-CM | POA: Diagnosis not present

## 2019-06-28 DIAGNOSIS — E1136 Type 2 diabetes mellitus with diabetic cataract: Secondary | ICD-10-CM | POA: Diagnosis not present

## 2019-06-28 DIAGNOSIS — R195 Other fecal abnormalities: Secondary | ICD-10-CM

## 2019-06-28 DIAGNOSIS — M1990 Unspecified osteoarthritis, unspecified site: Secondary | ICD-10-CM | POA: Diagnosis not present

## 2019-06-28 DIAGNOSIS — K5730 Diverticulosis of large intestine without perforation or abscess without bleeding: Secondary | ICD-10-CM | POA: Diagnosis not present

## 2019-06-28 DIAGNOSIS — E079 Disorder of thyroid, unspecified: Secondary | ICD-10-CM | POA: Insufficient documentation

## 2019-06-28 DIAGNOSIS — G8929 Other chronic pain: Secondary | ICD-10-CM | POA: Diagnosis not present

## 2019-06-28 DIAGNOSIS — K449 Diaphragmatic hernia without obstruction or gangrene: Secondary | ICD-10-CM | POA: Insufficient documentation

## 2019-06-28 DIAGNOSIS — Z7982 Long term (current) use of aspirin: Secondary | ICD-10-CM | POA: Diagnosis not present

## 2019-06-28 DIAGNOSIS — K648 Other hemorrhoids: Secondary | ICD-10-CM | POA: Insufficient documentation

## 2019-06-28 DIAGNOSIS — R1033 Periumbilical pain: Secondary | ICD-10-CM

## 2019-06-28 DIAGNOSIS — R1110 Vomiting, unspecified: Secondary | ICD-10-CM | POA: Insufficient documentation

## 2019-06-28 DIAGNOSIS — Z1211 Encounter for screening for malignant neoplasm of colon: Secondary | ICD-10-CM | POA: Diagnosis not present

## 2019-06-28 DIAGNOSIS — R112 Nausea with vomiting, unspecified: Secondary | ICD-10-CM

## 2019-06-28 DIAGNOSIS — D127 Benign neoplasm of rectosigmoid junction: Secondary | ICD-10-CM | POA: Diagnosis not present

## 2019-06-28 DIAGNOSIS — Z79899 Other long term (current) drug therapy: Secondary | ICD-10-CM | POA: Diagnosis not present

## 2019-06-28 HISTORY — PX: ESOPHAGOGASTRODUODENOSCOPY: SHX5428

## 2019-06-28 HISTORY — PX: COLONOSCOPY: SHX5424

## 2019-06-28 HISTORY — PX: BIOPSY: SHX5522

## 2019-06-28 HISTORY — PX: POLYPECTOMY: SHX5525

## 2019-06-28 LAB — CORTISOL: Cortisol, Plasma: 5.7 ug/dL

## 2019-06-28 LAB — GLUCOSE, CAPILLARY: Glucose-Capillary: 97 mg/dL (ref 70–99)

## 2019-06-28 SURGERY — EGD (ESOPHAGOGASTRODUODENOSCOPY)
Anesthesia: Moderate Sedation

## 2019-06-28 MED ORDER — SODIUM CHLORIDE 0.9 % IV SOLN
INTRAVENOUS | Status: DC
  Administered 2019-06-28 (×2): via INTRAVENOUS

## 2019-06-28 MED ORDER — METOCLOPRAMIDE HCL 5 MG PO TABS: 5.0000 mg | ORAL_TABLET | Freq: Three times a day (TID) | ORAL | 0 refills | Status: DC

## 2019-06-28 MED ORDER — MEPERIDINE HCL 50 MG/ML IJ SOLN
INTRAMUSCULAR | Status: DC | PRN
  Administered 2019-06-28 (×3): 25 mg via INTRAVENOUS

## 2019-06-28 MED ORDER — MIDAZOLAM HCL 5 MG/5ML IJ SOLN
INTRAMUSCULAR | Status: AC
  Filled 2019-06-28: qty 10

## 2019-06-28 MED ORDER — MEPERIDINE HCL 50 MG/ML IJ SOLN
INTRAMUSCULAR | Status: AC
  Filled 2019-06-28: qty 1

## 2019-06-28 MED ORDER — MIDAZOLAM HCL 5 MG/5ML IJ SOLN
INTRAMUSCULAR | Status: DC | PRN
  Administered 2019-06-28 (×6): 2 mg via INTRAVENOUS

## 2019-06-28 MED ORDER — LIDOCAINE VISCOUS HCL 2 % MT SOLN
OROMUCOSAL | Status: DC | PRN
  Administered 2019-06-28: 4 mL via OROMUCOSAL

## 2019-06-28 MED ORDER — ONDANSETRON HCL 4 MG/2ML IJ SOLN
INTRAMUSCULAR | Status: AC
  Filled 2019-06-28: qty 2

## 2019-06-28 MED ORDER — STERILE WATER FOR IRRIGATION IR SOLN
Status: DC | PRN
  Administered 2019-06-28: 15:00:00 2.5 mL

## 2019-06-28 MED ORDER — LIDOCAINE VISCOUS HCL 2 % MT SOLN
OROMUCOSAL | Status: AC
  Filled 2019-06-28: qty 15

## 2019-06-28 MED ORDER — MIDAZOLAM HCL 5 MG/5ML IJ SOLN
INTRAMUSCULAR | Status: AC
  Filled 2019-06-28: qty 5

## 2019-06-28 NOTE — Discharge Instructions (Signed)
No aspirin or NSAIDs for 24 hours. Resume other medications as before. Resume usual diet. No driving for 24 hours. Physician will call with results of blood test and biopsy.   Upper Endoscopy, Adult, Care After This sheet gives you information about how to care for yourself after your procedure. Your health care provider may also give you more specific instructions. If you have problems or questions, contact your health care provider. What can I expect after the procedure? After the procedure, it is common to have:  A sore throat.  Mild stomach pain or discomfort.  Bloating.  Nausea. Follow these instructions at home:   Follow instructions from your health care provider about what to eat or drink after your procedure.  Return to your normal activities as told by your health care provider. Ask your health care provider what activities are safe for you.  Take over-the-counter and prescription medicines only as told by your health care provider.  Do not drive for 24 hours if you were given a sedative during your procedure.  Keep all follow-up visits as told by your health care provider. This is important. Contact a health care provider if you have:  A sore throat that lasts longer than one day.  Trouble swallowing. Get help right away if:  You vomit blood or your vomit looks like coffee grounds.  You have: ? A fever. ? Bloody, black, or tarry stools. ? A severe sore throat or you cannot swallow. ? Difficulty breathing. ? Severe pain in your chest or abdomen. Summary  After the procedure, it is common to have a sore throat, mild stomach discomfort, bloating, and nausea.  Do not drive for 24 hours if you were given a sedative during the procedure.  Follow instructions from your health care provider about what to eat or drink after your procedure.  Return to your normal activities as told by your health care provider. This information is not intended to replace advice  given to you by your health care provider. Make sure you discuss any questions you have with your health care provider. Document Released: 02/08/2012 Document Revised: 01/31/2018 Document Reviewed: 01/09/2018 Elsevier Patient Education  2020 [employer].  Colonoscopy, Adult, Care After This sheet gives you information about how to care for yourself after your procedure. Your doctor may also give you more specific instructions. If you have problems or questions, call your doctor. What can I expect after the procedure? After the procedure, it is common to have:  A small amount of blood in your poop for 24 hours.  Some gas.  Mild cramping or bloating in your belly. Follow these instructions at home: General instructions  For the first 24 hours after the procedure: ? Do not drive or use machinery. ? Do not sign important documents. ? Do not drink alcohol. ? Do your daily activities more slowly than normal. ? Eat foods that are soft and easy to digest.  Take over-the-counter or prescription medicines only as told by your doctor. To help cramping and bloating:   Try walking around.  Put heat on your belly (abdomen) as told by your doctor. Use a heat source that your doctor recommends, such as a moist heat pack or a heating pad. ? Put a towel between your skin and the heat source. ? Leave the heat on for 20-30 minutes. ? Remove the heat if your skin turns bright red. This is especially important if you cannot feel pain, heat, or cold. You can get burned. Eating  and drinking   Drink enough fluid to keep your pee (urine) clear or pale yellow.  Return to your normal diet as told by your doctor. Avoid heavy or fried foods that are hard to digest.  Avoid drinking alcohol for as long as told by your doctor. Contact a doctor if:  You have blood in your poop (stool) 2-3 days after the procedure. Get help right away if:  You have more than a small amount of blood in your poop.  You  see large clumps of tissue (blood clots) in your poop.  Your belly is swollen.  You feel sick to your stomach (nauseous).  You throw up (vomit).  You have a fever.  You have belly pain that gets worse, and medicine does not help your pain. Summary  After the procedure, it is common to have a small amount of blood in your poop. You may also have mild cramping and bloating in your belly.  For the first 24 hours after the procedure, do not drive or use machinery, do not sign important documents, and do not drink alcohol.  Get help right away if you have a lot of blood in your poop, feel sick to your stomach, have a fever, or have more belly pain. This information is not intended to replace advice given to you by your health care provider. Make sure you discuss any questions you have with your health care provider. Document Released: 09/11/2010 Document Revised: 06/09/2017 Document Reviewed: 05/03/2016 Elsevier Patient Education  2020 [employer].  Colon Polyps  Polyps are tissue growths inside the body. Polyps can grow in many places, including the large intestine (colon). A polyp may be a round bump or a mushroom-shaped growth. You could have one polyp or several. Most colon polyps are noncancerous (benign). However, some colon polyps can become cancerous over time. Finding and removing the polyps early can help prevent this. What are the causes? The exact cause of colon polyps is not known. What increases the risk? You are more likely to develop this condition if you:  Have a family history of colon cancer or colon polyps.  Are older than 20 or older than 45 if you are African American.  Have inflammatory bowel disease, such as ulcerative colitis or Crohn's disease.  Have certain hereditary conditions, such as: ? Familial adenomatous polyposis. ? Lynch syndrome. ? Turcot syndrome. ? Peutz-Jeghers syndrome.  Are overweight.  Smoke cigarettes.  Do not get enough  exercise.  Drink too much alcohol.  Eat a diet that is high in fat and red meat and low in fiber.  Had childhood cancer that was treated with abdominal radiation. What are the signs or symptoms? Most polyps do not cause symptoms. If you have symptoms, they may include:  Blood coming from your rectum when having a bowel movement.  Blood in your stool. The stool may look dark red or black.  Abdominal pain.  A change in bowel habits, such as constipation or diarrhea. How is this diagnosed? This condition is diagnosed with a colonoscopy. This is a procedure in which a lighted, flexible scope is inserted into the anus and then passed into the colon to examine the area. Polyps are sometimes found when a colonoscopy is done as part of routine cancer screening tests. How is this treated? Treatment for this condition involves removing any polyps that are found. Most polyps can be removed during a colonoscopy. Those polyps will then be tested for cancer. Additional treatment may be needed  depending on the results of testing. Follow these instructions at home: Lifestyle  Maintain a healthy weight, or lose weight if recommended by your health care provider.  Exercise every day or as told by your health care provider.  Do not use any products that contain nicotine or tobacco, such as cigarettes and e-cigarettes. If you need help quitting, ask your health care provider.  If you drink alcohol, limit how much you have: ? 0-1 drink a day for women. ? 0-2 drinks a day for men.  Be aware of how much alcohol is in your drink. In the [place] one drink equals one 12 oz bottle of beer (355 mL), one 5 oz glass of wine (148 mL), or one 1 oz shot of hard liquor (44 mL). Eating and drinking   Eat foods that are high in fiber, such as fruits, vegetables, and whole grains.  Eat foods that are high in calcium and vitamin D, such as milk, cheese, yogurt, eggs, liver, fish, and broccoli.  Limit foods that  are high in fat, such as fried foods and desserts.  Limit the amount of red meat and processed meat you eat, such as hot dogs, sausage, bacon, and lunch meats. General instructions  Keep all follow-up visits as told by your health care provider. This is important. ? This includes having regularly scheduled colonoscopies. ? Talk to your health care provider about when you need a colonoscopy. Contact a health care provider if:  You have new or worsening bleeding during a bowel movement.  You have new or increased blood in your stool.  You have a change in bowel habits.  You lose weight for no known reason. Summary  Polyps are tissue growths inside the body. Polyps can grow in many places, including the colon.  Most colon polyps are noncancerous (benign), but some can become cancerous over time.  This condition is diagnosed with a colonoscopy.  Treatment for this condition involves removing any polyps that are found. Most polyps can be removed during a colonoscopy. This information is not intended to replace advice given to you by your health care provider. Make sure you discuss any questions you have with your health care provider. Document Released: 05/05/2004 Document Revised: 11/24/2017 Document Reviewed: 11/24/2017 Elsevier Patient Education  2020 [place], Adult     A hernia happens when tissue inside your body pushes out through a weak spot in your belly muscles (abdominal wall). This makes a round lump (bulge). The lump may be:  In a scar from surgery that was done in your belly (incisional hernia).  Near your belly button (umbilical hernia).  In your groin (inguinal hernia). Your groin is the area where your leg meets your lower belly (abdomen). This kind of hernia could also be: ? In your scrotum, if you are female. ? In folds of skin around your vagina, if you are female.  In your upper thigh (femoral hernia).  Inside your belly (hiatal hernia). This  happens when your stomach slides above the muscle between your belly and your chest (diaphragm). If your hernia is small and it does not cause pain, you may not need treatment. If your hernia is large or it causes pain, you may need surgery. Follow these instructions at home: Activity  Avoid stretching or overusing (straining) the muscles near your hernia. Straining can happen when you: ? Lift something heavy. ? Poop (have a bowel movement).  Do not lift anything that is heavier than 10 lb (4.5  kg), or the limit that you are told, until your doctor says that it is safe.  Use the strength of your legs when you lift something heavy. Do not use only your back muscles to lift. General instructions  Do these things if told by your doctor so you do not have trouble pooping (constipation): ? Drink enough fluid to keep your pee (urine) pale yellow. ? Eat foods that are high in fiber. These include fresh fruits and vegetables, whole grains, and beans. ? Limit foods that are high in fat and processed sugars. These include foods that are fried or sweet. ? Take medicine for trouble pooping.  When you cough, try to cough gently.  You may try to push your hernia in by very gently pressing on it when you are lying down. Do not try to force the bulge back in if it will not push in easily.  If you are overweight, work with your doctor to lose weight safely.  Do not use any products that have nicotine or tobacco in them. These include cigarettes and e-cigarettes. If you need help quitting, ask your doctor.  If you will be having surgery (hernia repair), watch your hernia for changes in shape, size, or color. Tell your doctor if you see any changes.  Take over-the-counter and prescription medicines only as told by your doctor.  Keep all follow-up visits as told by your doctor. Contact a doctor if:  You get new pain, swelling, or redness near your hernia.  You poop fewer times in a week than  normal.  You have trouble pooping.  You have poop (stool) that is more dry than normal.  You have poop that is harder or larger than normal. Get help right away if:  You have a fever.  You have belly pain that gets worse.  You feel sick to your stomach (nauseous).  You throw up (vomit).  Your hernia cannot be pushed in by very gently pressing on it when you are lying down. Do not try to force the bulge back in if it will not push in easily.  Your hernia: ? Changes in shape or size. ? Changes color. ? Feels hard or it hurts when you touch it. These symptoms may represent a serious problem that is an emergency. Do not wait to see if the symptoms will go away. Get medical help right away. Call your local emergency services (911 in the [place] Summary  A hernia happens when tissue inside your body pushes out through a weak spot in the belly muscles. This creates a bulge.  If your hernia is small and it does not hurt, you may not need treatment. If your hernia is large or it hurts, you may need surgery.  If you will be having surgery, watch your hernia for changes in shape, size, or color. Tell your doctor about any changes. This information is not intended to replace advice given to you by your health care provider. Make sure you discuss any questions you have with your health care provider. Document Released: 01/27/2010 Document Revised: 11/30/2018 Document Reviewed: 05/11/2017 Elsevier Patient Education  [place].  Diverticulosis  Diverticulosis is a condition that develops when small pouches (diverticula) form in the wall of the large intestine (colon). The colon is where water is absorbed and stool is formed. The pouches form when the inside layer of the colon pushes through weak spots in the outer layers of the colon. You may have a few pouches or many  of them. What are the causes? The cause of this condition is not known. What increases the risk? The following  factors may make you more likely to develop this condition:  Being older than age 60. Your risk for this condition increases with age. Diverticulosis is rare among people younger than age 6. By age 53, many people have it.  Eating a low-fiber diet.  Having frequent constipation.  Being overweight.  Not getting enough exercise.  Smoking.  Taking over-the-counter pain medicines, like aspirin and ibuprofen.  Having a family history of diverticulosis. What are the signs or symptoms? In most people, there are no symptoms of this condition. If you do have symptoms, they may include:  Bloating.  Cramps in the abdomen.  Constipation or diarrhea.  Pain in the lower left side of the abdomen. How is this diagnosed? This condition is most often diagnosed during an exam for other colon problems. Because diverticulosis usually has no symptoms, it often cannot be diagnosed independently. This condition may be diagnosed by:  Using a flexible scope to examine the colon (colonoscopy).  Taking an X-ray of the colon after dye has been put into the colon (barium enema).  Doing a CT scan. How is this treated? You may not need treatment for this condition if you have never developed an infection related to diverticulosis. If you have had an infection before, treatment may include:  Eating a high-fiber diet. This may include eating more fruits, vegetables, and grains.  Taking a fiber supplement.  Taking a live bacteria supplement (probiotic).  Taking medicine to relax your colon.  Taking antibiotic medicines. Follow these instructions at home:  Drink 6-8 glasses of water or more each day to prevent constipation.  Try not to strain when you have a bowel movement.  If you have had an infection before: ? Eat more fiber as directed by your health care provider or your diet and nutrition specialist (dietitian). ? Take a fiber supplement or probiotic, if your health care provider  approves.  Take over-the-counter and prescription medicines only as told by your health care provider.  If you were prescribed an antibiotic, take it as told by your health care provider. Do not stop taking the antibiotic even if you start to feel better.  Keep all follow-up visits as told by your health care provider. This is important. Contact a health care provider if:  You have pain in your abdomen.  You have bloating.  You have cramps.  You have not had a bowel movement in 3 days. Get help right away if:  Your pain gets worse.  Your bloating becomes very bad.  You have a fever or chills, and your symptoms suddenly get worse.  You vomit.  You have bowel movements that are bloody or black.  You have bleeding from your rectum. Summary  Diverticulosis is a condition that develops when small pouches (diverticula) form in the wall of the large intestine (colon).  You may have a few pouches or many of them.  This condition is most often diagnosed during an exam for other colon problems.  If you have had an infection related to diverticulosis, treatment may include increasing the fiber in your diet, taking supplements, or taking medicines. This information is not intended to replace advice given to you by your health care provider. Make sure you discuss any questions you have with your health care provider. Document Released: 05/06/2004 Document Revised: 07/22/2017 Document Reviewed: 06/28/2016 Elsevier Patient Education  2020 [employer].  Hemorrhoids Hemorrhoids are swollen veins that may develop:  In the butt (rectum). These are called internal hemorrhoids.  Around the opening of the butt (anus). These are called external hemorrhoids. Hemorrhoids can cause pain, itching, or bleeding. Most of the time, they do not cause serious problems. They usually get better with diet changes, lifestyle changes, and other home treatments. What are the causes? This condition may  be caused by:  Having trouble pooping (constipation).  Pushing hard (straining) to poop.  Watery poop (diarrhea).  Pregnancy.  Being very overweight (obese).  Sitting for long periods of time.  Heavy lifting or other activity that causes you to strain.  Anal sex.  Riding a bike for a long period of time. What are the signs or symptoms? Symptoms of this condition include:  Pain.  Itching or soreness in the butt.  Bleeding from the butt.  Leaking poop.  Swelling in the area.  One or more lumps around the opening of your butt. How is this diagnosed? A doctor can often diagnose this condition by looking at the affected area. The doctor may also:  Do an exam that involves feeling the area with a gloved hand (digital rectal exam).  Examine the area inside your butt using a small tube (anoscope).  Order blood tests. This may be done if you have lost a lot of blood.  Have you get a test that involves looking inside the colon using a flexible tube with a camera on the end (sigmoidoscopy or colonoscopy). How is this treated? This condition can usually be treated at home. Your doctor may tell you to change what you eat, make lifestyle changes, or try home treatments. If these do not help, procedures can be done to remove the hemorrhoids or make them smaller. These may involve:  Placing rubber bands at the base of the hemorrhoids to cut off their blood supply.  Injecting medicine into the hemorrhoids to shrink them.  Shining a type of light energy onto the hemorrhoids to cause them to fall off.  Doing surgery to remove the hemorrhoids or cut off their blood supply. Follow these instructions at home: Eating and drinking   Eat foods that have a lot of fiber in them. These include whole grains, beans, nuts, fruits, and vegetables.  Ask your doctor about taking products that have added fiber (fibersupplements).  Reduce the amount of fat in your diet. You can do this  by: ? Eating low-fat dairy products. ? Eating less red meat. ? Avoiding processed foods.  Drink enough fluid to keep your pee (urine) pale yellow. Managing pain and swelling   Take a warm-water bath (sitz bath) for 20 minutes to ease pain. Do this 3-4 times a day. You may do this in a bathtub or using a portable sitz bath that fits over the toilet.  If told, put ice on the painful area. It may be helpful to use ice between your warm baths. ? Put ice in a plastic bag. ? Place a towel between your skin and the bag. ? Leave the ice on for 20 minutes, 2-3 times a day. General instructions  Take over-the-counter and prescription medicines only as told by your doctor. ? Medicated creams and medicines may be used as told.  Exercise often. Ask your doctor how much and what kind of exercise is best for you.  Go to the bathroom when you have the urge to poop. Do not wait.  Avoid pushing too hard when you poop.  Keep  your butt dry and clean. Use wet toilet paper or moist towelettes after pooping.  Do not sit on the toilet for a long time.  Keep all follow-up visits as told by your doctor. This is important. Contact a doctor if you:  Have pain and swelling that do not get better with treatment or medicine.  Have trouble pooping.  Cannot poop.  Have pain or swelling outside the area of the hemorrhoids. Get help right away if you have:  Bleeding that will not stop. Summary  Hemorrhoids are swollen veins in the butt or around the opening of the butt.  They can cause pain, itching, or bleeding.  Eat foods that have a lot of fiber in them. These include whole grains, beans, nuts, fruits, and vegetables.  Take a warm-water bath (sitz bath) for 20 minutes to ease pain. Do this 3-4 times a day. This information is not intended to replace advice given to you by your health care provider. Make sure you discuss any questions you have with your health care provider. Document Released:  05/18/2008 Document Revised: 08/17/2018 Document Reviewed: 12/29/2017 Elsevier Patient Education  2020 [employer].

## 2019-06-28 NOTE — Op Note (Signed)
[facility] Patient Name: Wendy Fuller Procedure Date: 06/28/2019 2:19 PM MRN: [id number] Date of Birth: 12-13-1940 Attending MD: Hildred Laser , MD CSN: [id number] Age: 78 Admit Type: Outpatient Procedure:                Upper GI endoscopy Indications:              Nausea with vomiting, Weight loss Providers:                Hildred Laser, MD, Otis Peak B. Sharon Seller, RN, Raphael Gibney, Technician Referring MD:             Glenda Chroman, MD Medicines:                Lidocaine spray, Meperidine 50 mg IV, Midazolam 8                            mg IV Complications:            No immediate complications. Estimated Blood Loss:     Estimated blood loss was minimal. Procedure:                Pre-Anesthesia Assessment:                           - Prior to the procedure, a History and Physical                            was performed, and patient medications and                            allergies were reviewed. The patient's tolerance of                            previous anesthesia was also reviewed. The risks                            and benefits of the procedure and the sedation                            options and risks were discussed with the patient.                            All questions were answered, and informed consent                            was obtained. Prior Anticoagulants: The patient has                            taken no previous anticoagulant or antiplatelet                            agents except for aspirin. ASA Grade Assessment:  III - A patient with severe systemic disease. After                            reviewing the risks and benefits, the patient was                            deemed in satisfactory condition to undergo the                            procedure.                           After obtaining informed consent, the endoscope was                            passed under direct vision. Throughout  the                            procedure, the patient's blood pressure, pulse, and                            oxygen saturations were monitored continuously. The                            [id number]) scope was introduced through the                            mouth, and advanced to the second part of duodenum.                            The upper GI endoscopy was accomplished without                            difficulty. The patient tolerated the procedure                            well. Scope In: 2:45:32 PM Scope Out: 2:53:40 PM Total Procedure Duration: 0 hours 8 minutes 8 seconds  Findings:      The examined esophagus was normal.      The Z-line was regular and was found 32 cm from the incisors.      A 2 cm hiatal hernia was present.      The entire examined stomach was normal.      The duodenal bulb and second portion of the duodenum were normal.       Biopsies were taken with a cold forceps for histology. The pathology       specimen was placed into Bottle Number 1. Impression:               - Normal esophagus.                           - Z-line regular, 32 cm from the incisors.                           - 2  cm hiatal hernia.                           - Normal stomach.                           - Normal duodenal bulb and second portion of the                            duodenum. Biopsied. Moderate Sedation:      Moderate (conscious) sedation was administered by the endoscopy nurse       and supervised by the endoscopist. The following parameters were       monitored: oxygen saturation, heart rate, blood pressure, CO2       capnography and response to care. Total physician intraservice time was       17 minutes. Recommendation:           - Patient has a contact number available for                            emergencies. The signs and symptoms of potential                            delayed complications were discussed with the                            patient.  Return to normal activities tomorrow.                            Written discharge instructions were provided to the                            patient.                           - Resume previous diet today.                           - Continue present medications.                           - No aspirin, ibuprofen, naproxen, or other                            non-steroidal anti-inflammatory drugs for 1 day.                           - Await pathology results.                           - Use metoclopramide (Reglan) 5 mg PO ac.                           - Random cortisol level. Procedure Code(s):        --- Professional ---  [id number], Esophagogastroduodenoscopy, flexible,                            transoral; with biopsy, single or multiple                           G0500, Moderate sedation services provided by the                            same physician or other qualified health care                            professional performing a gastrointestinal                            endoscopic service that sedation supports,                            requiring the presence of an independent trained                            observer to assist in the monitoring of the                            patient's level of consciousness and physiological                            status; initial 15 minutes of intra-service time;                            patient age 92 years or older (additional time may                            be reported with [phone], as appropriate) Diagnosis Code(s):        --- Professional ---                           K44.9, Diaphragmatic hernia without obstruction or                            gangrene                           R11.2, Nausea with vomiting, unspecified                           R63.4, Abnormal weight loss CPT copyright 2019 American Medical Association. All rights reserved. The codes documented in this report are preliminary and upon  coder review may  be revised to meet current compliance requirements. Hildred Laser, MD Hildred Laser, MD 06/28/2019 3:34:30 PM This report has been signed electronically. Number of Addenda: 0

## 2019-06-28 NOTE — Op Note (Signed)
[facility] Patient Name: Wendy Fuller Procedure Date: 06/28/2019 2:56 PM MRN: [id number] Date of Birth: 11-22-1940 Attending MD: Hildred Laser , MD CSN: [id number] Age: 78 Admit Type: Outpatient Procedure:                Colonoscopy Indications:              Screening for colorectal malignant neoplasm,                            Screening in patient at increased risk: Colorectal                            cancer in father before age 74 Providers:                Hildred Laser, MD, Otis Peak B. Sharon Seller, RN, Raphael Gibney, Technician Referring MD:             Glenda Chroman, MD Medicines:                Meperidine 25 mg IV, Midazolam 4 mg IV Complications:            No immediate complications. Estimated Blood Loss:     Estimated blood loss was minimal. Procedure:                Pre-Anesthesia Assessment:                           - Prior to the procedure, a History and Physical                            was performed, and patient medications and                            allergies were reviewed. The patient's tolerance of                            previous anesthesia was also reviewed. The risks                            and benefits of the procedure and the sedation                            options and risks were discussed with the patient.                            All questions were answered, and informed consent                            was obtained. Prior Anticoagulants: The patient has                            taken no previous anticoagulant or antiplatelet  agents except for aspirin. ASA Grade Assessment:                            III - A patient with severe systemic disease. After                            reviewing the risks and benefits, the patient was                            deemed in satisfactory condition to undergo the                            procedure.                           After obtaining  informed consent, the colonoscope                            was passed under direct vision. Throughout the                            procedure, the patient's blood pressure, pulse, and                            oxygen saturations were monitored continuously. The                            PCF-[id number] was introduced through the                            anus and advanced to the the cecum, identified by                            appendiceal orifice and ileocecal valve. The                            colonoscopy was technically difficult and complex                            due to a redundant colon. The patient tolerated the                            procedure well. The quality of the bowel                            preparation was adequate. The ileocecal valve,                            appendiceal orifice, and rectum were photographed. Scope In: 2:58:45 PM Scope Out: 3:25:17 PM Scope Withdrawal Time: 0 hours 11 minutes 20 seconds  Total Procedure Duration: 0 hours 26 minutes 32 seconds  Findings:      The perianal and digital rectal examinations were normal.      Multiple small and large-mouthed diverticula were found in  the sigmoid       colon.      A small polyp was found in the recto-sigmoid colon. The polyp was       removed with a cold snare. Resection and retrieval were complete. The       pathology specimen was placed into Bottle Number 2.      Internal hemorrhoids were found during retroflexion. The hemorrhoids       were small. Impression:               - Diverticulosis in the sigmoid colon.                           - One small polyp at the recto-sigmoid colon,                            removed with a cold snare. Resected and retrieved.                           - Internal hemorrhoids. Moderate Sedation:      Moderate (conscious) sedation was administered by the endoscopy nurse       and supervised by the endoscopist. The following parameters were        monitored: oxygen saturation, heart rate, blood pressure, CO2       capnography and response to care. Total physician intraservice time was       27 minutes. Recommendation:           - Patient has a contact number available for                            emergencies. The signs and symptoms of potential                            delayed complications were discussed with the                            patient. Return to normal activities tomorrow.                            Written discharge instructions were provided to the                            patient.                           - Resume previous diet today.                           - Continue present medications.                           - No aspirin, ibuprofen, naproxen, or other                            non-steroidal anti-inflammatory drugs for 1 day.                           -  Await pathology results.                           - No recommendation at this time regarding repeat                            colonoscopy. Procedure Code(s):        --- Professional ---                           [phone], Colonoscopy, flexible; with removal of                            tumor(s), polyp(s), or other lesion(s) by snare                            technique                           99153, Moderate sedation; each additional 15                            minutes intraservice time                           G0500, Moderate sedation services provided by the                            same physician or other qualified health care                            professional performing a gastrointestinal                            endoscopic service that sedation supports,                            requiring the presence of an independent trained                            observer to assist in the monitoring of the                            patient's level of consciousness and physiological                            status; initial 15 minutes  of intra-service time;                            patient age 53 years or older (additional time may                            be reported with [phone], as appropriate) Diagnosis Code(s):        --- Professional ---  Z12.11, Encounter for screening for malignant                            neoplasm of colon                           Z80.0, Family history of malignant neoplasm of                            digestive organs                           K64.8, Other hemorrhoids                           K63.5, Polyp of colon                           K57.30, Diverticulosis of large intestine without                            perforation or abscess without bleeding CPT copyright 2019 American Medical Association. All rights reserved. The codes documented in this report are preliminary and upon coder review may  be revised to meet current compliance requirements. Hildred Laser, MD Hildred Laser, MD 06/28/2019 3:39:23 PM This report has been signed electronically. Number of Addenda: 0

## 2019-06-28 NOTE — H&P (Signed)
Wendy Fuller is an 78 y.o. female.   Chief Complaint: Patient is here for esophagogastroduodenoscopy and colonoscopy. HPI: Patient is 78 year old Caucasian female with history of GERD on famotidine who presents with 7 to 4-month history of postprandial nausea and vomiting anorexia and she has lost 22 pounds since she has been sick.  No history of hematemesis melena or rectal bleeding.  She is prone to constipation.  Her last colonoscopy was well over 10 years ago by Dr. Benson Norway of [facility]. Patient underwent EGD with ED back in December 2019 for solid food dysphagia.  She now has occasional difficulty but no episodes of food impaction.  She has chronic back pain.  She does not take pain medication daily.  She may take no more than couple of doses a week. Recent lab studies are unremarkable.  Serum creatinine was 0.97.  Creatinine was was 2.97 back in March 2020. Family history significant for colon carcinoma in her father who was 58 at the time of diagnosis and ended up with a colostomy.  Her mother died of esophageal carcinoma in her 9s.  She smoked cigarettes  Past Medical History:  Diagnosis Date  . Arthritis   . Back pain   . Cataract   . Depression   . Diabetes ([place])    x 3 yrs  . Hypertension   . Thyroid disease     Past Surgical History:  Procedure Laterality Date  . ABDOMINAL HYSTERECTOMY    . BALLOON DILATION N/A 12/21/2013   Procedure: BALLOON DILATION;  Surgeon: Rogene Houston, MD;  [facility]: [facility];  Service: Endoscopy;  Laterality: N/A;  . CESAREAN SECTION    . complete hyster    . ESOPHAGEAL DILATION N/A 07/26/2018   Procedure: ESOPHAGEAL DILATION;  Surgeon: Rogene Houston, MD;  Location: [facility];  Service: Endoscopy;  Laterality: N/A;  . ESOPHAGOGASTRODUODENOSCOPY N/A 12/21/2013   Procedure: ESOPHAGOGASTRODUODENOSCOPY (EGD);  Surgeon: Rogene Houston, MD;  [facility]: [facility];  Service: Endoscopy;  Laterality: N/A;  730  .  ESOPHAGOGASTRODUODENOSCOPY N/A 07/26/2018   Procedure: ESOPHAGOGASTRODUODENOSCOPY (EGD);  Surgeon: Rogene Houston, MD;  [facility]: [facility];  Service: Endoscopy;  Laterality: N/A;  7:30  . ESOPHAGOGASTRODUODENOSCOPY (EGD) WITH ESOPHAGEAL DILATION N/A 08/01/2013   Procedure: ESOPHAGOGASTRODUODENOSCOPY (EGD) WITH ESOPHAGEAL DILATION;  Surgeon: Rogene Houston, MD;  [facility]: [facility];  Service: Endoscopy;  Laterality: N/A;  345-moved to 100 [place] notified pt  . EYE SURGERY    . MALONEY DILATION N/A 12/21/2013   Procedure: Venia Minks DILATION;  Surgeon: Rogene Houston, MD;  [facility]: [facility];  Service: Endoscopy;  Laterality: N/A;  . SAVORY DILATION N/A 12/21/2013   Procedure: SAVORY DILATION;  Surgeon: Rogene Houston, MD;  Location: [facility];  Service: Endoscopy;  Laterality: N/A;  . SHOULDER SURGERY    . THYROID SURGERY      Family History  Problem Relation Age of Onset  . Cancer Mother   . Diabetes Mother   . Hyperlipidemia Mother   . Cancer Father    Social History:  reports that she has never smoked. She has never used smokeless tobacco. She reports that she does not drink alcohol or use drugs.  Allergies: No Known Allergies  Medications Prior to Admission  Medication Sig Dispense Refill  . aspirin EC 81 MG tablet Take 1 tablet (81 mg total) by mouth daily.    Marland Kitchen atorvastatin (LIPITOR) 10 MG tablet Take 10 mg by mouth  daily.    . famotidine (PEPCID) 20 MG tablet TAKE ONE CAPSULE BY MOUTH TWICE DAILY 60 tablet 5  . glimepiride (AMARYL) 4 MG tablet Take 4 mg by mouth daily before breakfast.      . HYDROcodone-acetaminophen (NORCO) 10-325 MG tablet Take 1 tablet by mouth every 6 (six) hours as needed (pain).    Marland Kitchen lisinopril-hydrochlorothiazide (PRINZIDE,ZESTORETIC) 10-12.5 MG per tablet Take 1 tablet by mouth daily.      . Multiple Vitamin (MULTIVITAMIN WITH MINERALS) TABS tablet Take 1 tablet by mouth daily. ALIVE MULTIVITAMIN    . OZEMPIC, 0.25 OR 0.5 MG/DOSE, 2  MG/1.5ML SOPN Inject 0.25 mg into the muscle every Friday.     . pioglitazone (ACTOS) 30 MG tablet Take 30 mg by mouth every evening.   4  . tiZANidine (ZANAFLEX) 4 MG tablet Take 4 mg by mouth 3 (three) times daily as needed for muscle spasms.  2  . amoxicillin (AMOXIL) 250 MG capsule Take 1 capsule (250 mg total) by mouth 2 (two) times daily. (Patient not taking: Reported on 06/20/2019) 10 capsule 0  . ondansetron (ZOFRAN ODT) 4 MG disintegrating tablet Take 1 tablet (4 mg total) by mouth every 8 (eight) hours as needed for nausea or vomiting. (Patient not taking: Reported on 06/20/2019) 20 tablet 0    Results for orders placed or performed during the hospital encounter of 06/28/19 (from the past 48 hour(s))  Glucose, capillary     Status: None   Collection Time: 06/28/19  1:23 PM  Result Value Ref Range   Glucose-Capillary 97 70 - 99 mg/dL   No results found.  ROS  Blood pressure 126/61, pulse 90, temperature 98.1 F (36.7 C), temperature source Oral, resp. rate 16, height 5\' 3"  (1.6 m), weight 66.2 kg, SpO2 98 %. Physical Exam  Constitutional: She appears well-developed and well-nourished.  HENT:  Mouth/Throat: Oropharynx is clear and moist.  Eyes: Conjunctivae are normal. No scleral icterus.  Neck: No thyromegaly present.  Cardiovascular: Normal rate, regular rhythm and normal heart sounds.  No murmur heard. Respiratory: Effort normal and breath sounds normal.  GI:  Abdomen is symmetrical.  She has mild generalized tenderness without guarding or rebound.  No organomegaly or masses.  Musculoskeletal:        General: No edema.  Lymphadenopathy:    She has no cervical adenopathy.  Neurological: She is alert.  Skin: Skin is warm and dry.     Assessment/Plan Nausea vomiting anorexia and weight loss. Diagnostic esophagogastroduodenoscopy and screening colonoscopy. Father had colon carcinoma at age 31.  Hildred Laser, MD 06/28/2019, 2:28 PM

## 2019-07-02 LAB — SURGICAL PATHOLOGY

## 2019-07-03 ENCOUNTER — Encounter (HOSPITAL_COMMUNITY): Payer: Self-pay | Admitting: Internal Medicine

## 2019-07-04 DIAGNOSIS — I10 Essential (primary) hypertension: Secondary | ICD-10-CM | POA: Diagnosis not present

## 2019-07-16 ENCOUNTER — Other Ambulatory Visit: Payer: Self-pay

## 2019-07-16 ENCOUNTER — Encounter (HOSPITAL_COMMUNITY): Payer: PPO

## 2019-07-16 ENCOUNTER — Encounter (HOSPITAL_COMMUNITY): Payer: Self-pay

## 2019-07-16 ENCOUNTER — Encounter (HOSPITAL_COMMUNITY)
Admission: RE | Admit: 2019-07-16 | Discharge: 2019-07-16 | Disposition: A | Discharge status: Home / Self Care | Payer: PPO | Source: Ambulatory Visit | Attending: Internal Medicine | Admitting: Internal Medicine

## 2019-07-16 DIAGNOSIS — E2740 Unspecified adrenocortical insufficiency: Secondary | ICD-10-CM | POA: Diagnosis not present

## 2019-07-16 LAB — ACTH STIMULATION, 3 TIME POINTS
Cortisol, 30 Min: 21.2 ug/dL
Cortisol, 60 Min: 28.3 ug/dL
Cortisol, Base: 10.3 ug/dL

## 2019-07-16 MED ORDER — COSYNTROPIN 0.25 MG IJ SOLR
0.2500 mg | Freq: Once | INTRAMUSCULAR | Status: AC
  Administered 2019-07-16: 0.25 mg via INTRAVENOUS

## 2019-07-23 DIAGNOSIS — E1165 Type 2 diabetes mellitus with hyperglycemia: Secondary | ICD-10-CM | POA: Diagnosis not present

## 2019-07-31 DIAGNOSIS — I10 Essential (primary) hypertension: Secondary | ICD-10-CM | POA: Diagnosis not present

## 2019-08-13 DIAGNOSIS — E1165 Type 2 diabetes mellitus with hyperglycemia: Secondary | ICD-10-CM | POA: Diagnosis not present

## 2019-08-14 DIAGNOSIS — Z299 Encounter for prophylactic measures, unspecified: Secondary | ICD-10-CM | POA: Diagnosis not present

## 2019-08-14 DIAGNOSIS — Z713 Dietary counseling and surveillance: Secondary | ICD-10-CM | POA: Diagnosis not present

## 2019-08-14 DIAGNOSIS — F419 Anxiety disorder, unspecified: Secondary | ICD-10-CM | POA: Diagnosis not present

## 2019-08-14 DIAGNOSIS — N390 Urinary tract infection, site not specified: Secondary | ICD-10-CM | POA: Diagnosis not present

## 2019-08-14 DIAGNOSIS — Z6824 Body mass index (BMI) 24.0-24.9, adult: Secondary | ICD-10-CM | POA: Diagnosis not present

## 2019-08-29 ENCOUNTER — Encounter (INDEPENDENT_AMBULATORY_CARE_PROVIDER_SITE_OTHER): Payer: Self-pay | Admitting: Gastroenterology

## 2019-08-29 ENCOUNTER — Ambulatory Visit (INDEPENDENT_AMBULATORY_CARE_PROVIDER_SITE_OTHER): Payer: PPO | Admitting: Gastroenterology

## 2019-08-29 ENCOUNTER — Other Ambulatory Visit: Payer: Self-pay

## 2019-08-29 VITALS — BP 122/70 | HR 85 | Temp 97.2°F | Ht 64.0 in | Wt 147.7 lb

## 2019-08-29 DIAGNOSIS — R112 Nausea with vomiting, unspecified: Secondary | ICD-10-CM

## 2019-08-29 MED ORDER — ONDANSETRON 4 MG PO TBDP: 4.0000 mg | ORAL_TABLET | Freq: Three times a day (TID) | ORAL | 1 refills | Status: DC | PRN

## 2019-08-29 MED ORDER — METOCLOPRAMIDE HCL 5 MG PO TABS: 5.0000 mg | ORAL_TABLET | Freq: Two times a day (BID) | ORAL | 3 refills | Status: DC

## 2019-08-29 NOTE — Patient Instructions (Addendum)
We are starting back on metoclopramide 5 mg twice a day-I also refilled medication for as needed nausea medicine.  Please contact me if the metoclopramide does not help your symptoms.  *Important notify me if you have any side effects such as tremors, twitches, abnormal movements.   Try to avoid a lot of raw vegetables as we discussed.  In general softer food trigger to digest easier.  Please monitor if nausea is worse on days you are taking the Norco.

## 2019-08-29 NOTE — Progress Notes (Signed)
Patient profile: Wendy Fuller is a 79 y.o. female seen for evaluation of heme + stool . Last seen in clinic 04/2019, had EGD 06/2019.  When last seen in clinic 04/2019 had abdominal pain, nausea, vomiting, CT which was unremarkable.  Had EGD November 2020 as below  History of Present Illness: Wendy Fuller is seen today for follow-up after endoscopy.  She reports at time of endoscopy she was prescribed metoclopramide 3 times daily 5 mg which significantly helped her nausea and vomiting.  She took this for 1 month and then stopped.  Over the past 2 months that she has been off symptoms have returned, she can either wake up with nausea or get it during the day.  Has not found it is clearly postprandial.  Currently having about significant nausea 3-5 times a week.  She takes her Zofran and this resolves nausea, if does not take Zofran may have nausea most of the day.  She denies any GERD symptoms with the nausea.  Has some chronic dysphagia to meats which she generally avoids and as long as he avoids does well.  States she tries to eat early in the day to avoid GERD at night. Overall feels she felt better when taking the 1 month course of reglan after egd.   She is having a soft bowel movement daily with Colace every day.  She denies any lower abdominal pain and blood in stool.  Takes Advil once a month for headache.  Takes Norco on empty stomach 2-3 times a week, unsure if this relates to the days she has nausea.  Wt Readings from Last 3 Encounters:  08/29/19 147 lb 11.2 oz (67 kg)  06/28/19 146 lb (66.2 kg)  06/06/19 145 lb 12.8 oz (66.1 kg)  06/2018--Weight--#163  Last Colonoscopy: 06/2019- Diverticulosis in the sigmoid colon. - One small polyp at the recto-sigmoid colon  - Internal hemorrhoids. Path- - Tubular adenoma.  - Negative for high grade dysplasia.   Last Endoscopy: 06/2019- Normal esophagus. - Z-line regular, 32 cm from the incisors.  - 2 cm hiatal hernia. - Normal stomach.  -  Normal duodenal bulb and second portion of the duodenum. Biopsied. -Path--duodenal mucosa with no significant pathologic findings.    Past Medical History:  Past Medical History:  Diagnosis Date  . Arthritis   . Back pain   . Cataract   . Depression   . Diabetes ([place])    x 3 yrs  . Hypertension   . Thyroid disease     Problem List: Patient Active Problem List   Diagnosis Date Noted  . Hyperlipidemia 06/06/2019  . Type 2 diabetes mellitus without complication, without long-term current use of insulin ([place]) 06/06/2019  . Anxiety 06/06/2019  . Abnormal weight loss 05/23/2019  . Heme + stool 05/23/2019  . Non-intractable vomiting 05/23/2019  . Periumbilical abdominal pain 05/10/2019  . Nausea with vomiting 04/26/2019  . RUQ abdominal pain 04/26/2019  . Esophageal dysphagia 06/27/2018  . Diabetes ([place]) 07/17/2013  . Essential hypertension, benign 07/17/2013  . Dysphagia, unspecified(787.20) 07/17/2013    Past Surgical History: Past Surgical History:  Procedure Laterality Date  . ABDOMINAL HYSTERECTOMY    . BALLOON DILATION N/A 12/21/2013   Procedure: BALLOON DILATION;  Surgeon: Rogene Houston, MD;  [facility];  Service: Endoscopy;  Laterality: N/A;  . BIOPSY  06/28/2019   Procedure: BIOPSY;  Surgeon: Rogene Houston, MD;  [facility];  Service: Endoscopy;;  duodenum   .  CESAREAN SECTION    . COLONOSCOPY N/A 06/28/2019   Procedure: COLONOSCOPY;  Surgeon: Rogene Houston, MD;  [facility];  Service: Endoscopy;  Laterality: N/A;  . complete hyster    . ESOPHAGEAL DILATION N/A 07/26/2018   Procedure: ESOPHAGEAL DILATION;  Surgeon: Rogene Houston, MD;  [facility];  Service: Endoscopy;  Laterality: N/A;  . ESOPHAGOGASTRODUODENOSCOPY N/A 12/21/2013   Procedure: ESOPHAGOGASTRODUODENOSCOPY (EGD);  Surgeon: Rogene Houston, MD;  [facility];  Service: Endoscopy;  Laterality: N/A;  730  . ESOPHAGOGASTRODUODENOSCOPY N/A  07/26/2018   Procedure: ESOPHAGOGASTRODUODENOSCOPY (EGD);  Surgeon: Rogene Houston, MD;  [facility];  Service: Endoscopy;  Laterality: N/A;  7:30  . ESOPHAGOGASTRODUODENOSCOPY N/A 06/28/2019   Procedure: ESOPHAGOGASTRODUODENOSCOPY (EGD);  Surgeon: Rogene Houston, MD;  [facility];  Service: Endoscopy;  Laterality: N/A;  200pm  . ESOPHAGOGASTRODUODENOSCOPY (EGD) WITH ESOPHAGEAL DILATION N/A 08/01/2013   Procedure: ESOPHAGOGASTRODUODENOSCOPY (EGD) WITH ESOPHAGEAL DILATION;  Surgeon: Rogene Houston, MD;  [facility];  Service: Endoscopy;  Laterality: N/A;  345-moved to 100 Keirah notified pt  . EYE SURGERY    . MALONEY DILATION N/A 12/21/2013   Procedure: Venia Minks DILATION;  Surgeon: Rogene Houston, MD;  [facility];  Service: Endoscopy;  Laterality: N/A;  . POLYPECTOMY  06/28/2019   Procedure: POLYPECTOMY;  Surgeon: Rogene Houston, MD;  [facility];  Service: Endoscopy;;  rectosigmoid  . SAVORY DILATION N/A 12/21/2013   Procedure: SAVORY DILATION;  Surgeon: Rogene Houston, MD;  [facility];  Service: Endoscopy;  Laterality: N/A;  . SHOULDER SURGERY    . THYROID SURGERY      Allergies: No Known Allergies    Home Medications:  Current Outpatient Medications:  .  aspirin EC 81 MG tablet, Take 1 tablet (81 mg total) by mouth daily., Disp:  , Rfl:  .  atorvastatin (LIPITOR) 10 MG tablet, Take 10 mg by mouth daily., Disp: , Rfl:  .  chlordiazePOXIDE-amitriptyline (LIMBITROL DS) 10-25 MG TABS tablet, Take 1 tablet by mouth 2 (two) times daily., Disp: , Rfl:  .  docusate sodium (COLACE) 100 MG capsule, Take 100 mg by mouth 2 (two) times daily., Disp: , Rfl:  .  glimepiride (AMARYL) 4 MG tablet, Take 4 mg by mouth 2 (two) times daily before a meal. , Disp: , Rfl:  .  HYDROcodone-acetaminophen (NORCO) 10-325 MG tablet, Take 1 tablet by mouth every 6 (six) hours as needed (pain)., Disp: , Rfl:  .  lisinopril-hydrochlorothiazide  (PRINZIDE,ZESTORETIC) 10-12.5 MG per tablet, Take 1 tablet by mouth daily.  , Disp: , Rfl:  .  Multiple Vitamin (MULTIVITAMIN WITH MINERALS) TABS tablet, Take 1 tablet by mouth daily. ALIVE MULTIVITAMIN, Disp: , Rfl:  .  oxyCODONE-acetaminophen (PERCOCET) 10-325 MG tablet, Take 1 tablet by mouth every 6 (six) hours as needed., Disp: , Rfl:  .  OZEMPIC, 0.25 OR 0.5 MG/DOSE, 2 MG/1.5ML SOPN, Inject 0.25 mg into the muscle every Friday. , Disp: , Rfl:  .  pioglitazone (ACTOS) 30 MG tablet, Take 30 mg by mouth every evening. , Disp: , Rfl: 4 .  tiZANidine (ZANAFLEX) 4 MG tablet, Take 4 mg by mouth 3 (three) times daily as needed for muscle spasms., Disp: , Rfl: 2 .  famotidine (PEPCID) 20 MG tablet, TAKE ONE CAPSULE BY MOUTH TWICE DAILY (Patient not taking: Reported on 08/29/2019), Disp: 60 tablet, Rfl: 5 .  metoCLOPramide (REGLAN) 5 MG tablet,  Take 1 tablet (5 mg total) by mouth 2 (two) times daily before a meal., Disp: 60 tablet, Rfl: 3 .  ondansetron (ZOFRAN ODT) 4 MG disintegrating tablet, Take 1 tablet (4 mg total) by mouth every 8 (eight) hours as needed for nausea or vomiting., Disp: 20 tablet, Rfl: 1   Family History: family history includes Cancer in her father and mother; Colon cancer in her father; Diabetes in her mother; Hyperlipidemia in her mother.   * colon cancer - father - age 38  *esophageal cancer mother - age 66s  Social History:   reports that she has never smoked. She has never used smokeless tobacco. She reports that she does not drink alcohol or use drugs.   Review of Systems: Constitutional: Denies weight loss/weight gain  Eyes: No changes in vision. ENT: No oral lesions, sore throat.  GI: see HPI.  Heme/Lymph: No easy bruising.  CV: No chest pain.  GU: No hematuria.  Integumentary: No rashes.  Neuro: No headaches.  Psych: No depression/anxiety.  Endocrine: No heat/cold intolerance.  Allergic/Immunologic: No urticaria.  Resp: No cough, SOB.  Musculoskeletal: No  joint swelling.    Physical Examination: BP 122/70 (BP Location: Right Arm, Patient Position: Sitting, Cuff Size: Large)   Pulse 85   Temp (!) 97.2 F (36.2 C) (Temporal)   Ht 5\' 4"  (1.626 m)   Wt 147 lb 11.2 oz (67 kg)   BMI 25.35 kg/m  Gen: NAD, alert and oriented x 4 HEENT: PEERLA, EOMI, Neck: supple, no JVD Chest: CTA bilaterally, no wheezes, crackles, or other adventitious sounds CV: RRR, no m/g/c/r Abd: soft, NT, ND, +BS in all four quadrants; no HSM, guarding, ridigity, or rebound tenderness Ext: no edema, well perfused with 2+ pulses, Skin: no rash or lesions noted on observed skin Lymph: no noted LAD  Data Reviewed:  CT scan 04/2019--IMPRESSION: 1. No non-contrast CT findings of the abdomen or pelvis to explain abdominal pain, weight loss, nausea, or vomiting. 2.  Status post hysterectomy. 3.  Diverticulosis without evidence of acute diverticulitis. 4.  Aortic Atherosclerosis  Labs 04/2019-normal creatinine 0.97.  LFTs were normal.  CRP 6.5.WBC 7.4.  Hemoglobin 13 and hematocrit 41.8  06/2019-cortisol level bease 10.3, 30 min 21.2, 60 min 28.3    Assessment/Plan: Ms. Lux is a 79 y.o. female   1.  Nausea vomiting-EGD and CT scan as well as labs have been fairly unremarkable.  She does have diabetes with some intermittent opioid use, she had symptom resolution with a 1 month course of Reglan prescribed after her endoscopy.  Will restart Reglan low dose 5mg  BID-side effect profile of tardive dyskinesia reviewed.  We did review gastroparesis diet modifications, she currently eats a lot of raw vegetables and recommended against this. Zofran for PRN use refilled-she benefits from this. She reports trying pepcid w/o improvement. Had wt loss at onset but now wt stable x 4 months.   Follow-up in 1 to 2 months-call sooner if no better. Hopefully at f/up if doing well w/ gastroparesis diet modifications can wean down reglan.      Raengel was seen today for  follow-up.  Diagnoses and all orders for this visit:  Nausea and vomiting, intractability of vomiting not specified, unspecified vomiting type  Other orders -     metoCLOPramide (REGLAN) 5 MG tablet; Take 1 tablet (5 mg total) by mouth 2 (two) times daily before a meal. -     ondansetron (ZOFRAN ODT) 4 MG disintegrating tablet; Take 1 tablet (4 mg  total) by mouth every 8 (eight) hours as needed for nausea or vomiting.     I personally performed the service, non-incident to. (WP)  Laurine Blazer, [facility]

## 2019-08-31 ENCOUNTER — Ambulatory Visit: Payer: PPO | Admitting: Urology

## 2019-09-03 DIAGNOSIS — I10 Essential (primary) hypertension: Secondary | ICD-10-CM | POA: Diagnosis not present

## 2019-09-04 DIAGNOSIS — Z6821 Body mass index (BMI) 21.0-21.9, adult: Secondary | ICD-10-CM | POA: Diagnosis not present

## 2019-09-04 DIAGNOSIS — J329 Chronic sinusitis, unspecified: Secondary | ICD-10-CM | POA: Diagnosis not present

## 2019-09-04 DIAGNOSIS — I739 Peripheral vascular disease, unspecified: Secondary | ICD-10-CM | POA: Diagnosis not present

## 2019-09-04 DIAGNOSIS — Z299 Encounter for prophylactic measures, unspecified: Secondary | ICD-10-CM | POA: Diagnosis not present

## 2019-09-04 DIAGNOSIS — I10 Essential (primary) hypertension: Secondary | ICD-10-CM | POA: Diagnosis not present

## 2019-09-04 DIAGNOSIS — Z789 Other specified health status: Secondary | ICD-10-CM | POA: Diagnosis not present

## 2019-09-12 DIAGNOSIS — M48061 Spinal stenosis, lumbar region without neurogenic claudication: Secondary | ICD-10-CM | POA: Diagnosis not present

## 2019-09-12 DIAGNOSIS — M47816 Spondylosis without myelopathy or radiculopathy, lumbar region: Secondary | ICD-10-CM | POA: Diagnosis not present

## 2019-09-12 DIAGNOSIS — I10 Essential (primary) hypertension: Secondary | ICD-10-CM | POA: Diagnosis not present

## 2019-09-12 DIAGNOSIS — E1165 Type 2 diabetes mellitus with hyperglycemia: Secondary | ICD-10-CM | POA: Diagnosis not present

## 2019-09-12 DIAGNOSIS — Z6829 Body mass index (BMI) 29.0-29.9, adult: Secondary | ICD-10-CM | POA: Diagnosis not present

## 2019-09-12 DIAGNOSIS — M5416 Radiculopathy, lumbar region: Secondary | ICD-10-CM | POA: Diagnosis not present

## 2019-10-04 ENCOUNTER — Other Ambulatory Visit: Payer: Self-pay

## 2019-10-04 ENCOUNTER — Encounter (INDEPENDENT_AMBULATORY_CARE_PROVIDER_SITE_OTHER): Payer: Self-pay | Admitting: Gastroenterology

## 2019-10-04 ENCOUNTER — Ambulatory Visit (INDEPENDENT_AMBULATORY_CARE_PROVIDER_SITE_OTHER): Payer: PPO | Admitting: Gastroenterology

## 2019-10-04 VITALS — BP 176/91 | HR 106 | Temp 97.1°F | Ht 64.0 in | Wt 153.7 lb

## 2019-10-04 DIAGNOSIS — R110 Nausea: Secondary | ICD-10-CM

## 2019-10-04 DIAGNOSIS — K5909 Other constipation: Secondary | ICD-10-CM | POA: Diagnosis not present

## 2019-10-04 DIAGNOSIS — Z299 Encounter for prophylactic measures, unspecified: Secondary | ICD-10-CM | POA: Diagnosis not present

## 2019-10-04 DIAGNOSIS — I10 Essential (primary) hypertension: Secondary | ICD-10-CM | POA: Diagnosis not present

## 2019-10-04 DIAGNOSIS — E7800 Pure hypercholesterolemia, unspecified: Secondary | ICD-10-CM | POA: Diagnosis not present

## 2019-10-04 DIAGNOSIS — E1165 Type 2 diabetes mellitus with hyperglycemia: Secondary | ICD-10-CM | POA: Diagnosis not present

## 2019-10-04 DIAGNOSIS — K219 Gastro-esophageal reflux disease without esophagitis: Secondary | ICD-10-CM

## 2019-10-04 DIAGNOSIS — Z6823 Body mass index (BMI) 23.0-23.9, adult: Secondary | ICD-10-CM | POA: Diagnosis not present

## 2019-10-04 MED ORDER — OMEPRAZOLE 40 MG PO CPDR: 40.0000 mg | DELAYED_RELEASE_CAPSULE | Freq: Every day | ORAL | 3 refills | Status: DC

## 2019-10-04 NOTE — Patient Instructions (Signed)
-  Recheck blood pressure after restarting BP medication  -I sent omeprazole to pharmacy to try for reflux - take 30 min before breakfast once a day  -Continue avoiding raw vegetables which may make symptoms worse  -We will try decreasing reglan to once a day - if symptoms get worse can increase back to twice a day.  -continue eating dinner early and avoid laying flat after meals

## 2019-10-04 NOTE — Progress Notes (Addendum)
Patient profile: Wendy Fuller is a 79 y.o. female seen for follow-up of nausea and vomiting, last seen January 2021.     Wendy Fuller is seen today and reports improvement in her nausea vomiting.  She has not needed any Zofran.  Last was nauseated several weeks ago.  She has been taking her Reglan twice daily and feels this helps.  She has decreased intake of raw vegetables some but is still eating raw cauliflower, etc, we discussed steaming, boiling, etc. She does have some reflux symptoms despite Pepcid twice a day.  She has tried eating earlier in the day which helps some, she denies dysphagia.  Her weight is up as below which she is pleased about, she had previously lost weight due to not eating well given she was not feeling well.   No lower GI complaints.  She has a bowel movement daily without gross blood.  No lower abdominal pain.  She does take Colace twice a day for regularity.  She does have some chronic  Wt Readings from Last 3 Encounters:  10/04/19 153 lb 11.2 oz (69.7 kg)  08/29/19 147 lb 11.2 oz (67 kg)  06/28/19 146 lb (66.2 kg)     Last Colonoscopy:  Last Endoscopy:    Past Medical History:  Past Medical History:  Diagnosis Date  . Arthritis   . Back pain   . Cataract   . Depression   . Diabetes ([place])    x 3 yrs  . Hypertension   . Thyroid disease     Problem List: Patient Active Problem List   Diagnosis Date Noted  . Hyperlipidemia 06/06/2019  . Type 2 diabetes mellitus without complication, without long-term current use of insulin ([place]) 06/06/2019  . Anxiety 06/06/2019  . Abnormal weight loss 05/23/2019  . Heme + stool 05/23/2019  . Non-intractable vomiting 05/23/2019  . Periumbilical abdominal pain 05/10/2019  . Nausea with vomiting 04/26/2019  . RUQ abdominal pain 04/26/2019  . Esophageal dysphagia 06/27/2018  . Diabetes ([place]) 07/17/2013  . Essential hypertension, benign 07/17/2013  . Dysphagia, unspecified(787.20) 07/17/2013    Past Surgical  History: Past Surgical History:  Procedure Laterality Date  . ABDOMINAL HYSTERECTOMY    . BALLOON DILATION N/A 12/21/2013   Procedure: BALLOON DILATION;  Surgeon: Rogene Houston, MD;  [facility];  Service: Endoscopy;  Laterality: N/A;  . BIOPSY  06/28/2019   Procedure: BIOPSY;  Surgeon: Rogene Houston, MD;  [facility];  Service: Endoscopy;;  duodenum   . CESAREAN SECTION    . COLONOSCOPY N/A 06/28/2019   Procedure: COLONOSCOPY;  Surgeon: Rogene Houston, MD;  [facility];  Service: Endoscopy;  Laterality: N/A;  . complete hyster    . ESOPHAGEAL DILATION N/A 07/26/2018   Procedure: ESOPHAGEAL DILATION;  Surgeon: Rogene Houston, MD;  [facility];  Service: Endoscopy;  Laterality: N/A;  . ESOPHAGOGASTRODUODENOSCOPY N/A 12/21/2013   Procedure: ESOPHAGOGASTRODUODENOSCOPY (EGD);  Surgeon: Rogene Houston, MD;  [facility];  Service: Endoscopy;  Laterality: N/A;  730  . ESOPHAGOGASTRODUODENOSCOPY N/A 07/26/2018   Procedure: ESOPHAGOGASTRODUODENOSCOPY (EGD);  Surgeon: Rogene Houston, MD;  [facility];  Service: Endoscopy;  Laterality: N/A;  7:30  . ESOPHAGOGASTRODUODENOSCOPY N/A 06/28/2019   Procedure: ESOPHAGOGASTRODUODENOSCOPY (EGD);  Surgeon: Rogene Houston, MD;  [facility];  Service: Endoscopy;  Laterality: N/A;  200pm  . ESOPHAGOGASTRODUODENOSCOPY (EGD) WITH ESOPHAGEAL DILATION N/A 08/01/2013   Procedure: ESOPHAGOGASTRODUODENOSCOPY (EGD)  WITH ESOPHAGEAL DILATION;  Surgeon: Rogene Houston, MD;  [facility];  Service: Endoscopy;  Laterality: N/A;  345-moved to 100 [place] notified pt  . EYE SURGERY    . MALONEY DILATION N/A 12/21/2013   Procedure: Venia Minks DILATION;  Surgeon: Rogene Houston, MD;  [facility];  Service: Endoscopy;  Laterality: N/A;  . POLYPECTOMY  06/28/2019   Procedure: POLYPECTOMY;  Surgeon: Rogene Houston, MD;  [facility];  Service: Endoscopy;;   rectosigmoid  . SAVORY DILATION N/A 12/21/2013   Procedure: SAVORY DILATION;  Surgeon: Rogene Houston, MD;  [facility];  Service: Endoscopy;  Laterality: N/A;  . SHOULDER SURGERY    . THYROID SURGERY      Allergies: No Known Allergies    Home Medications:  Current Outpatient Medications:  .  aspirin EC 81 MG tablet, Take 1 tablet (81 mg total) by mouth daily., Disp:  , Rfl:  .  atorvastatin (LIPITOR) 10 MG tablet, Take 10 mg by mouth daily., Disp: , Rfl:  .  chlordiazePOXIDE-amitriptyline (LIMBITROL DS) 10-25 MG TABS tablet, Take 1 tablet by mouth 2 (two) times daily., Disp: , Rfl:  .  docusate sodium (COLACE) 100 MG capsule, Take 100 mg by mouth 2 (two) times daily., Disp: , Rfl:  .  famotidine (PEPCID) 20 MG tablet, TAKE ONE CAPSULE BY MOUTH TWICE DAILY, Disp: 60 tablet, Rfl: 5 .  glimepiride (AMARYL) 4 MG tablet, Take 4 mg by mouth 2 (two) times daily before a meal. , Disp: , Rfl:  .  HYDROcodone-acetaminophen (NORCO) 10-325 MG tablet, Take 1 tablet by mouth every 6 (six) hours as needed (pain)., Disp: , Rfl:  .  lisinopril-hydrochlorothiazide (PRINZIDE,ZESTORETIC) 10-12.5 MG per tablet, Take 1 tablet by mouth daily.  , Disp: , Rfl:  .  metoCLOPramide (REGLAN) 5 MG tablet, Take 1 tablet (5 mg total) by mouth 2 (two) times daily before a meal., Disp: 60 tablet, Rfl: 3 .  Multiple Vitamin (MULTIVITAMIN WITH MINERALS) TABS tablet, Take 1 tablet by mouth daily. ALIVE MULTIVITAMIN, Disp: , Rfl:  .  ondansetron (ZOFRAN ODT) 4 MG disintegrating tablet, Take 1 tablet (4 mg total) by mouth every 8 (eight) hours as needed for nausea or vomiting., Disp: 20 tablet, Rfl: 1 .  oxyCODONE-acetaminophen (PERCOCET) 10-325 MG tablet, Take 1 tablet by mouth every 6 (six) hours as needed., Disp: , Rfl:  .  OZEMPIC, 0.25 OR 0.5 MG/DOSE, 2 MG/1.5ML SOPN, Inject 0.25 mg into the muscle every Friday. , Disp: , Rfl:  .  pioglitazone (ACTOS) 30 MG tablet, Take 30 mg by mouth every evening. , Disp: ,  Rfl: 4 .  tiZANidine (ZANAFLEX) 4 MG tablet, Take 4 mg by mouth 3 (three) times daily as needed for muscle spasms., Disp: , Rfl: 2 .  omeprazole (PRILOSEC) 40 MG capsule, Take 1 capsule (40 mg total) by mouth daily., Disp: 30 capsule, Rfl: 3   Family History: family history includes Cancer in her father and mother; Colon cancer in her father; Diabetes in her mother; Hyperlipidemia in her mother.    Social History:   reports that she has never smoked. She has never used smokeless tobacco. She reports that she does not drink alcohol or use drugs.   Review of Systems: Constitutional:, Has regained weight. Eyes: No changes in vision. ENT: No oral lesions, sore throat.  GI: see HPI.  Heme/Lymph: No easy bruising.  CV: No chest pain.  GU: No hematuria.  Integumentary: No rashes.  Neuro: No headaches.  Psych: No depression/anxiety.  Endocrine: No heat/cold intolerance.  Allergic/Immunologic: No urticaria.  Resp: No cough, SOB.  Musculoskeletal: No joint swelling.    Physical Examination: BP (!) 176/91 (BP Location: Right Arm, Patient Position: Sitting, Cuff Size: Large)   Pulse (!) 106   Temp (!) 97.1 F (36.2 C) (Temporal)   Ht 5\' 4"  (1.626 m)   Wt 153 lb 11.2 oz (69.7 kg)   BMI 26.38 kg/m   Patient has been out of her blood pressure medication for the past month.  She had this refilled this morning at her primary.  She has a blood pressure cuff at home to monitor and ensure it returns to normal. Gen: NAD, alert and oriented x 4 HEENT: PEERLA, EOMI, Neck: supple, no JVD Chest: CTA bilaterally, no wheezes, crackles, or other adventitious sounds CV: RRR, no m/g/c/r Abd: soft, NT, ND, +BS in all four quadrants; no HSM, guarding, ridigity, or rebound tenderness Ext: no edema, well perfused with 2+ pulses, Skin: no rash or lesions noted on observed skin Lymph: no noted LAD  Data Reviewed:  CT scan 04/2019--IMPRESSION: 1. No non-contrast CT findings of the abdomen or pelvis to  explain abdominal pain, weight loss, nausea, or vomiting. 2. Status post hysterectomy. 3. Diverticulosis without evidence of acute diverticulitis. 4. Aortic Atherosclerosis  Labs 04/2019-normal creatinine 0.97. LFTs were normal. CRP 6.5.WBC 7.4. Hemoglobin 13 and hematocrit 41.8  06/2019-cortisol level bease 10.3, 30 min 21.2, 60 min 28.3   Assessment/Plan: Ms. Coulson is a 79 y.o. female   1.  Nausea vomiting-work-up including EGD and CT scan prior to last visit unremarkable.  She has improved with a course of Reglan at 5 mg twice daily.  We continue to discuss diet modifications, will try decreasing to Reglan once a day 5 g, she will notify me if symptoms return and can go back to twice a day.  Tardive dyskinesia side effect reviewed.  2.  GERD symptoms-despite Pepcid, will try her on Prilosec 40 mg once a day.  3.  Chronic constipation-resolved with stool softener twice daily.  Continue  Follow-up 6 months if feeling well, otherwise call sooner if needed.  1. Chronic GERD  2. Nausea without vomiting  3. Chronic constipation  Other orders - omeprazole (PRILOSEC) 40 MG capsule; Take 1 capsule (40 mg total) by mouth daily.  Dispense: 30 capsule; Refill: 3   I personally performed the service, non-incident to. (WP)  Laurine Blazer, [facility]

## 2019-10-18 NOTE — Progress Notes (Deleted)
Subjective: No diagnosis found.   Mrs. Wendy Fuller returns today in f/u. she has a history of atrophic vaginitis with recurrent UTI's and incomplete emptying. She remains on estrace cream and was given tamsulosin in August and is doing better with that. Her PVR is minimal today. She had been on nitrofurantion for suppression. She has no dysuria. She has daytime frequency but only nocturia x 1. She has no urgency and only minimal SUI. She has had no hematuria.   ROS:  ROS  No Known Allergies  Past Medical History:  Diagnosis Date  . Arthritis   . Back pain   . Cataract   . Depression   . Diabetes ([place])    x 3 yrs  . Hypertension   . Thyroid disease     Past Surgical History:  Procedure Laterality Date  . ABDOMINAL HYSTERECTOMY    . BALLOON DILATION N/A 12/21/2013   Procedure: BALLOON DILATION;  Surgeon: Rogene Houston, MD;  [facility];  Service: Endoscopy;  Laterality: N/A;  . BIOPSY  06/28/2019   Procedure: BIOPSY;  Surgeon: Rogene Houston, MD;  [facility];  Service: Endoscopy;;  duodenum   . CESAREAN SECTION    . COLONOSCOPY N/A 06/28/2019   Procedure: COLONOSCOPY;  Surgeon: Rogene Houston, MD;  [facility];  Service: Endoscopy;  Laterality: N/A;  . complete hyster    . ESOPHAGEAL DILATION N/A 07/26/2018   Procedure: ESOPHAGEAL DILATION;  Surgeon: Rogene Houston, MD;  [facility];  Service: Endoscopy;  Laterality: N/A;  . ESOPHAGOGASTRODUODENOSCOPY N/A 12/21/2013   Procedure: ESOPHAGOGASTRODUODENOSCOPY (EGD);  Surgeon: Rogene Houston, MD;  [facility];  Service: Endoscopy;  Laterality: N/A;  730  . ESOPHAGOGASTRODUODENOSCOPY N/A 07/26/2018   Procedure: ESOPHAGOGASTRODUODENOSCOPY (EGD);  Surgeon: Rogene Houston, MD;  [facility];  Service: Endoscopy;  Laterality: N/A;  7:30  . ESOPHAGOGASTRODUODENOSCOPY N/A 06/28/2019   Procedure: ESOPHAGOGASTRODUODENOSCOPY (EGD);  Surgeon: Rogene Houston, MD;   [facility];  Service: Endoscopy;  Laterality: N/A;  200pm  . ESOPHAGOGASTRODUODENOSCOPY (EGD) WITH ESOPHAGEAL DILATION N/A 08/01/2013   Procedure: ESOPHAGOGASTRODUODENOSCOPY (EGD) WITH ESOPHAGEAL DILATION;  Surgeon: Rogene Houston, MD;  [facility];  Service: Endoscopy;  Laterality: N/A;  345-moved to 100 [place] notified pt  . EYE SURGERY    . MALONEY DILATION N/A 12/21/2013   Procedure: Venia Minks DILATION;  Surgeon: Rogene Houston, MD;  [facility];  Service: Endoscopy;  Laterality: N/A;  . POLYPECTOMY  06/28/2019   Procedure: POLYPECTOMY;  Surgeon: Rogene Houston, MD;  [facility];  Service: Endoscopy;;  rectosigmoid  . SAVORY DILATION N/A 12/21/2013   Procedure: SAVORY DILATION;  Surgeon: Rogene Houston, MD;  [facility];  Service: Endoscopy;  Laterality: N/A;  . SHOULDER SURGERY    . THYROID SURGERY      Social History   Socioeconomic History  . Marital status: Married    Spouse name: Not on file  . Number of children: Not on file  . Years of education: Not on file  . Highest education level: Not on file  Occupational History  . Occupation: retired  Tobacco Use  . Smoking status: Never Smoker  . Smokeless tobacco: Never Used  Substance and Sexual Activity  . Alcohol use: No  . Drug use: No  . Sexual activity: Not Currently  Other Topics Concern  . Not on file  Social History Narrative  .  Not on file   Social Determinants of Health   Financial Resource Strain:   . Difficulty of Paying Living Expenses: Not on file  Food Insecurity:   . Worried About Charity fundraiser in the Last Year: Not on file  . Ran Out of Food in the Last Year: Not on file  Transportation Needs:   . Lack of Transportation (Medical): Not on file  . Lack of Transportation (Non-Medical): Not on file  Physical Activity:   . Days of Exercise per Week: Not on file  . Minutes of Exercise per Session: Not on file  Stress:   . Feeling of Stress  : Not on file  Social Connections:   . Frequency of Communication with Friends and Family: Not on file  . Frequency of Social Gatherings with Friends and Family: Not on file  . Attends Religious Services: Not on file  . Active Member of Clubs or Organizations: Not on file  . Attends Archivist Meetings: Not on file  . Marital Status: Not on file  Intimate Partner Violence:   . Fear of Current or Ex-Partner: Not on file  . Emotionally Abused: Not on file  . Physically Abused: Not on file  . Sexually Abused: Not on file    Family History  Problem Relation Age of Onset  . Cancer Mother   . Diabetes Mother   . Hyperlipidemia Mother   . Cancer Father   . Colon cancer Father     Anti-infectives: Anti-infectives (From admission, onward)   None      Current Outpatient Medications  Medication Sig Dispense Refill  . aspirin EC 81 MG tablet Take 1 tablet (81 mg total) by mouth daily.    Marland Kitchen atorvastatin (LIPITOR) 10 MG tablet Take 10 mg by mouth daily.    . chlordiazePOXIDE-amitriptyline (LIMBITROL DS) 10-25 MG TABS tablet Take 1 tablet by mouth 2 (two) times daily.    Marland Kitchen docusate sodium (COLACE) 100 MG capsule Take 100 mg by mouth 2 (two) times daily.    . famotidine (PEPCID) 20 MG tablet TAKE ONE CAPSULE BY MOUTH TWICE DAILY 60 tablet 5  . glimepiride (AMARYL) 4 MG tablet Take 4 mg by mouth 2 (two) times daily before a meal.     . HYDROcodone-acetaminophen (NORCO) 10-325 MG tablet Take 1 tablet by mouth every 6 (six) hours as needed (pain).    Marland Kitchen lisinopril-hydrochlorothiazide (PRINZIDE,ZESTORETIC) 10-12.5 MG per tablet Take 1 tablet by mouth daily.      . metoCLOPramide (REGLAN) 5 MG tablet Take 1 tablet (5 mg total) by mouth 2 (two) times daily before a meal. 60 tablet 3  . Multiple Vitamin (MULTIVITAMIN WITH MINERALS) TABS tablet Take 1 tablet by mouth daily. ALIVE MULTIVITAMIN    . omeprazole (PRILOSEC) 40 MG capsule Take 1 capsule (40 mg total) by mouth daily. 30 capsule 3   . ondansetron (ZOFRAN ODT) 4 MG disintegrating tablet Take 1 tablet (4 mg total) by mouth every 8 (eight) hours as needed for nausea or vomiting. 20 tablet 1  . oxyCODONE-acetaminophen (PERCOCET) 10-325 MG tablet Take 1 tablet by mouth every 6 (six) hours as needed.    Marland Kitchen OZEMPIC, 0.25 OR 0.5 MG/DOSE, 2 MG/1.5ML SOPN Inject 0.25 mg into the muscle every Friday.     . pioglitazone (ACTOS) 30 MG tablet Take 30 mg by mouth every evening.   4  . tiZANidine (ZANAFLEX) 4 MG tablet Take 4 mg by mouth 3 (three) times daily as needed for muscle  spasms.  2   No current facility-administered medications for this visit.     Objective: Vital signs in last 24 hours: @VSRANGES @  Intake/Output from previous day: No intake/output data recorded. Intake/Output this shift: @IOTHISSHIFT @   Physical Exam  Lab Results:  No results found for this or any previous visit (from the past 24 hour(s)).  BMET No results for input(s): NA, K, CL, CO2, GLUCOSE, BUN, CREATININE, CALCIUM in the last 72 hours. PT/INR No results for input(s): LABPROT, INR in the last 72 hours. ABG No results for input(s): PHART, HCO3 in the last 72 hours.  Invalid input(s): PCO2, PO2  Studies/Results: No results found.   Assessment/Plan: No problem-specific Assessment & Plan notes found for this encounter.   No orders of the defined types were placed in this encounter.    No orders of the defined types were placed in this encounter.    No follow-ups on file.    CC: ***     Irine Seal 10/18/2019 [phone]

## 2019-10-19 ENCOUNTER — Other Ambulatory Visit (INDEPENDENT_AMBULATORY_CARE_PROVIDER_SITE_OTHER): Payer: Self-pay | Admitting: Gastroenterology

## 2019-10-19 ENCOUNTER — Ambulatory Visit: Payer: PPO | Admitting: Urology

## 2019-10-21 DIAGNOSIS — E1165 Type 2 diabetes mellitus with hyperglycemia: Secondary | ICD-10-CM | POA: Diagnosis not present

## 2019-10-22 DIAGNOSIS — I10 Essential (primary) hypertension: Secondary | ICD-10-CM | POA: Diagnosis not present

## 2019-11-05 ENCOUNTER — Inpatient Hospital Stay: Admission: AD | Admit: 2019-11-05 | Payer: PPO | Source: Other Acute Inpatient Hospital | Admitting: Internal Medicine

## 2019-11-05 DIAGNOSIS — W19XXXA Unspecified fall, initial encounter: Secondary | ICD-10-CM | POA: Diagnosis not present

## 2019-11-05 DIAGNOSIS — M25561 Pain in right knee: Secondary | ICD-10-CM | POA: Diagnosis not present

## 2019-11-05 DIAGNOSIS — R0902 Hypoxemia: Secondary | ICD-10-CM | POA: Diagnosis not present

## 2019-11-05 DIAGNOSIS — S299XXA Unspecified injury of thorax, initial encounter: Secondary | ICD-10-CM | POA: Diagnosis not present

## 2019-11-05 DIAGNOSIS — Z20822 Contact with and (suspected) exposure to covid-19: Secondary | ICD-10-CM | POA: Diagnosis not present

## 2019-11-05 DIAGNOSIS — S7291XA Unspecified fracture of right femur, initial encounter for closed fracture: Secondary | ICD-10-CM | POA: Diagnosis not present

## 2019-11-05 DIAGNOSIS — I10 Essential (primary) hypertension: Secondary | ICD-10-CM | POA: Diagnosis not present

## 2019-11-05 DIAGNOSIS — W010XXA Fall on same level from slipping, tripping and stumbling without subsequent striking against object, initial encounter: Secondary | ICD-10-CM | POA: Diagnosis not present

## 2019-11-05 DIAGNOSIS — R52 Pain, unspecified: Secondary | ICD-10-CM | POA: Diagnosis not present

## 2019-11-05 DIAGNOSIS — S72001A Fracture of unspecified part of neck of right femur, initial encounter for closed fracture: Secondary | ICD-10-CM | POA: Diagnosis not present

## 2019-11-05 DIAGNOSIS — S8991XA Unspecified injury of right lower leg, initial encounter: Secondary | ICD-10-CM | POA: Diagnosis not present

## 2019-11-06 DIAGNOSIS — S72041A Displaced fracture of base of neck of right femur, initial encounter for closed fracture: Secondary | ICD-10-CM | POA: Diagnosis not present

## 2019-11-06 DIAGNOSIS — Z7984 Long term (current) use of oral hypoglycemic drugs: Secondary | ICD-10-CM | POA: Diagnosis not present

## 2019-11-06 DIAGNOSIS — G8929 Other chronic pain: Secondary | ICD-10-CM | POA: Diagnosis not present

## 2019-11-06 DIAGNOSIS — S8991XA Unspecified injury of right lower leg, initial encounter: Secondary | ICD-10-CM | POA: Diagnosis not present

## 2019-11-06 DIAGNOSIS — F339 Major depressive disorder, recurrent, unspecified: Secondary | ICD-10-CM | POA: Diagnosis not present

## 2019-11-06 DIAGNOSIS — M549 Dorsalgia, unspecified: Secondary | ICD-10-CM | POA: Diagnosis not present

## 2019-11-06 DIAGNOSIS — K219 Gastro-esophageal reflux disease without esophagitis: Secondary | ICD-10-CM | POA: Diagnosis not present

## 2019-11-06 DIAGNOSIS — S299XXA Unspecified injury of thorax, initial encounter: Secondary | ICD-10-CM | POA: Diagnosis not present

## 2019-11-06 DIAGNOSIS — S72041D Displaced fracture of base of neck of right femur, subsequent encounter for closed fracture with routine healing: Secondary | ICD-10-CM | POA: Diagnosis not present

## 2019-11-06 DIAGNOSIS — Z20822 Contact with and (suspected) exposure to covid-19: Secondary | ICD-10-CM | POA: Diagnosis not present

## 2019-11-06 DIAGNOSIS — Z880 Allergy status to penicillin: Secondary | ICD-10-CM | POA: Diagnosis not present

## 2019-11-06 DIAGNOSIS — W010XXA Fall on same level from slipping, tripping and stumbling without subsequent striking against object, initial encounter: Secondary | ICD-10-CM | POA: Diagnosis not present

## 2019-11-06 DIAGNOSIS — S72051A Unspecified fracture of head of right femur, initial encounter for closed fracture: Secondary | ICD-10-CM | POA: Diagnosis not present

## 2019-11-06 DIAGNOSIS — Z9181 History of falling: Secondary | ICD-10-CM | POA: Diagnosis not present

## 2019-11-06 DIAGNOSIS — R41841 Cognitive communication deficit: Secondary | ICD-10-CM | POA: Diagnosis not present

## 2019-11-06 DIAGNOSIS — N390 Urinary tract infection, site not specified: Secondary | ICD-10-CM | POA: Diagnosis not present

## 2019-11-06 DIAGNOSIS — F411 Generalized anxiety disorder: Secondary | ICD-10-CM | POA: Diagnosis not present

## 2019-11-06 DIAGNOSIS — E1165 Type 2 diabetes mellitus with hyperglycemia: Secondary | ICD-10-CM | POA: Diagnosis not present

## 2019-11-06 DIAGNOSIS — S7291XA Unspecified fracture of right femur, initial encounter for closed fracture: Secondary | ICD-10-CM | POA: Diagnosis not present

## 2019-11-06 DIAGNOSIS — R2689 Other abnormalities of gait and mobility: Secondary | ICD-10-CM | POA: Diagnosis not present

## 2019-11-06 DIAGNOSIS — M25561 Pain in right knee: Secondary | ICD-10-CM | POA: Diagnosis not present

## 2019-11-06 DIAGNOSIS — S72001A Fracture of unspecified part of neck of right femur, initial encounter for closed fracture: Secondary | ICD-10-CM | POA: Diagnosis not present

## 2019-11-06 DIAGNOSIS — I10 Essential (primary) hypertension: Secondary | ICD-10-CM | POA: Diagnosis not present

## 2019-11-06 DIAGNOSIS — M6281 Muscle weakness (generalized): Secondary | ICD-10-CM | POA: Diagnosis not present

## 2019-11-13 DIAGNOSIS — E1165 Type 2 diabetes mellitus with hyperglycemia: Secondary | ICD-10-CM | POA: Diagnosis not present

## 2019-11-13 DIAGNOSIS — S72001A Fracture of unspecified part of neck of right femur, initial encounter for closed fracture: Secondary | ICD-10-CM | POA: Diagnosis not present

## 2019-11-13 DIAGNOSIS — M6281 Muscle weakness (generalized): Secondary | ICD-10-CM | POA: Diagnosis not present

## 2019-11-13 DIAGNOSIS — S72009D Fracture of unspecified part of neck of unspecified femur, subsequent encounter for closed fracture with routine healing: Secondary | ICD-10-CM | POA: Diagnosis not present

## 2019-11-13 DIAGNOSIS — N390 Urinary tract infection, site not specified: Secondary | ICD-10-CM | POA: Diagnosis not present

## 2019-11-13 DIAGNOSIS — M549 Dorsalgia, unspecified: Secondary | ICD-10-CM | POA: Diagnosis not present

## 2019-11-13 DIAGNOSIS — R41841 Cognitive communication deficit: Secondary | ICD-10-CM | POA: Diagnosis not present

## 2019-11-13 DIAGNOSIS — F411 Generalized anxiety disorder: Secondary | ICD-10-CM | POA: Diagnosis not present

## 2019-11-13 DIAGNOSIS — S7291XA Unspecified fracture of right femur, initial encounter for closed fracture: Secondary | ICD-10-CM | POA: Diagnosis not present

## 2019-11-13 DIAGNOSIS — S72041D Displaced fracture of base of neck of right femur, subsequent encounter for closed fracture with routine healing: Secondary | ICD-10-CM | POA: Diagnosis not present

## 2019-11-13 DIAGNOSIS — I10 Essential (primary) hypertension: Secondary | ICD-10-CM | POA: Diagnosis not present

## 2019-11-13 DIAGNOSIS — K219 Gastro-esophageal reflux disease without esophagitis: Secondary | ICD-10-CM | POA: Diagnosis not present

## 2019-11-13 DIAGNOSIS — Z9181 History of falling: Secondary | ICD-10-CM | POA: Diagnosis not present

## 2019-11-13 DIAGNOSIS — R2689 Other abnormalities of gait and mobility: Secondary | ICD-10-CM | POA: Diagnosis not present

## 2019-11-13 DIAGNOSIS — G8929 Other chronic pain: Secondary | ICD-10-CM | POA: Diagnosis not present

## 2019-11-13 DIAGNOSIS — F339 Major depressive disorder, recurrent, unspecified: Secondary | ICD-10-CM | POA: Diagnosis not present

## 2019-11-14 DIAGNOSIS — S72001A Fracture of unspecified part of neck of right femur, initial encounter for closed fracture: Secondary | ICD-10-CM | POA: Diagnosis not present

## 2019-11-14 DIAGNOSIS — I10 Essential (primary) hypertension: Secondary | ICD-10-CM | POA: Diagnosis not present

## 2019-11-14 DIAGNOSIS — M549 Dorsalgia, unspecified: Secondary | ICD-10-CM | POA: Diagnosis not present

## 2019-11-18 HISTORY — PX: ORIF HIP FRACTURE: SHX2125

## 2019-11-20 DIAGNOSIS — N390 Urinary tract infection, site not specified: Secondary | ICD-10-CM | POA: Diagnosis not present

## 2019-11-20 DIAGNOSIS — S7291XA Unspecified fracture of right femur, initial encounter for closed fracture: Secondary | ICD-10-CM | POA: Diagnosis not present

## 2019-11-20 DIAGNOSIS — I10 Essential (primary) hypertension: Secondary | ICD-10-CM | POA: Diagnosis not present

## 2019-11-21 DIAGNOSIS — S72009D Fracture of unspecified part of neck of unspecified femur, subsequent encounter for closed fracture with routine healing: Secondary | ICD-10-CM | POA: Diagnosis not present

## 2019-11-28 DIAGNOSIS — Z7984 Long term (current) use of oral hypoglycemic drugs: Secondary | ICD-10-CM | POA: Diagnosis not present

## 2019-11-28 DIAGNOSIS — Z7982 Long term (current) use of aspirin: Secondary | ICD-10-CM | POA: Diagnosis not present

## 2019-11-28 DIAGNOSIS — E119 Type 2 diabetes mellitus without complications: Secondary | ICD-10-CM | POA: Diagnosis not present

## 2019-11-28 DIAGNOSIS — S72041D Displaced fracture of base of neck of right femur, subsequent encounter for closed fracture with routine healing: Secondary | ICD-10-CM | POA: Diagnosis not present

## 2019-11-28 DIAGNOSIS — I10 Essential (primary) hypertension: Secondary | ICD-10-CM | POA: Diagnosis not present

## 2019-11-28 DIAGNOSIS — Z9181 History of falling: Secondary | ICD-10-CM | POA: Diagnosis not present

## 2019-11-28 DIAGNOSIS — Z79891 Long term (current) use of opiate analgesic: Secondary | ICD-10-CM | POA: Diagnosis not present

## 2019-11-30 ENCOUNTER — Ambulatory Visit: Payer: PPO | Admitting: Urology

## 2019-11-30 NOTE — Progress Notes (Deleted)
Subjective: No diagnosis found.   05/02/15: Wendy Fuller returns today in f/u. she has a history of atrophic vaginitis with recurrent UTI's and incomplete emptying. She remains on estrace cream and was given tamsulosin in August and is doing better with that. Her PVR is minimal today. She had been on nitrofurantion for suppression. She has no dysuria. She has daytime frequency but only nocturia x 1. She has no urgency and only minimal SUI. She has had no hematuria.   ROS:  ROS  No Known Allergies  Past Medical History:  Diagnosis Date  . Arthritis   . Back pain   . Cataract   . Depression   . Diabetes ([place])    x 3 yrs  . Hypertension   . Thyroid disease     Past Surgical History:  Procedure Laterality Date  . ABDOMINAL HYSTERECTOMY    . BALLOON DILATION N/A 12/21/2013   Procedure: BALLOON DILATION;  Surgeon: Rogene Houston, MD;  [facility];  Service: Endoscopy;  Laterality: N/A;  . BIOPSY  06/28/2019   Procedure: BIOPSY;  Surgeon: Rogene Houston, MD;  [facility];  Service: Endoscopy;;  duodenum   . CESAREAN SECTION    . COLONOSCOPY N/A 06/28/2019   Procedure: COLONOSCOPY;  Surgeon: Rogene Houston, MD;  [facility];  Service: Endoscopy;  Laterality: N/A;  . complete hyster    . ESOPHAGEAL DILATION N/A 07/26/2018   Procedure: ESOPHAGEAL DILATION;  Surgeon: Rogene Houston, MD;  [facility];  Service: Endoscopy;  Laterality: N/A;  . ESOPHAGOGASTRODUODENOSCOPY N/A 12/21/2013   Procedure: ESOPHAGOGASTRODUODENOSCOPY (EGD);  Surgeon: Rogene Houston, MD;  [facility];  Service: Endoscopy;  Laterality: N/A;  730  . ESOPHAGOGASTRODUODENOSCOPY N/A 07/26/2018   Procedure: ESOPHAGOGASTRODUODENOSCOPY (EGD);  Surgeon: Rogene Houston, MD;  [facility];  Service: Endoscopy;  Laterality: N/A;  7:30  . ESOPHAGOGASTRODUODENOSCOPY N/A 06/28/2019   Procedure: ESOPHAGOGASTRODUODENOSCOPY (EGD);  Surgeon: Rogene Houston, MD;   [facility];  Service: Endoscopy;  Laterality: N/A;  200pm  . ESOPHAGOGASTRODUODENOSCOPY (EGD) WITH ESOPHAGEAL DILATION N/A 08/01/2013   Procedure: ESOPHAGOGASTRODUODENOSCOPY (EGD) WITH ESOPHAGEAL DILATION;  Surgeon: Rogene Houston, MD;  [facility];  Service: Endoscopy;  Laterality: N/A;  345-moved to 100 [place] notified pt  . EYE SURGERY    . MALONEY DILATION N/A 12/21/2013   Procedure: Venia Minks DILATION;  Surgeon: Rogene Houston, MD;  [facility];  Service: Endoscopy;  Laterality: N/A;  . POLYPECTOMY  06/28/2019   Procedure: POLYPECTOMY;  Surgeon: Rogene Houston, MD;  [facility];  Service: Endoscopy;;  rectosigmoid  . SAVORY DILATION N/A 12/21/2013   Procedure: SAVORY DILATION;  Surgeon: Rogene Houston, MD;  [facility];  Service: Endoscopy;  Laterality: N/A;  . SHOULDER SURGERY    . THYROID SURGERY      Social History   Socioeconomic History  . Marital status: Married    Spouse name: Not on file  . Number of children: Not on file  . Years of education: Not on file  . Highest education level: Not on file  Occupational History  . Occupation: retired  Tobacco Use  . Smoking status: Never Smoker  . Smokeless tobacco: Never Used  Substance and Sexual Activity  . Alcohol use: No  . Drug use: No  . Sexual activity: Not Currently  Other Topics Concern  . Not on file  Social History Narrative  .  Not on file   Social Determinants of Health   Financial Resource Strain:   . Difficulty of Paying Living Expenses:   Food Insecurity:   . Worried About Charity fundraiser in the Last Year:   . Arboriculturist in the Last Year:   Transportation Needs:   . Film/video editor (Medical):   Marland Kitchen Lack of Transportation (Non-Medical):   Physical Activity:   . Days of Exercise per Week:   . Minutes of Exercise per Session:   Stress:   . Feeling of Stress :   Social Connections:   . Frequency of Communication with Friends and  Family:   . Frequency of Social Gatherings with Friends and Family:   . Attends Religious Services:   . Active Member of Clubs or Organizations:   . Attends Archivist Meetings:   Marland Kitchen Marital Status:   Intimate Partner Violence:   . Fear of Current or Ex-Partner:   . Emotionally Abused:   Marland Kitchen Physically Abused:   . Sexually Abused:     Family History  Problem Relation Age of Onset  . Cancer Mother   . Diabetes Mother   . Hyperlipidemia Mother   . Cancer Father   . Colon cancer Father     Anti-infectives: Anti-infectives (From admission, onward)   None      Current Outpatient Medications  Medication Sig Dispense Refill  . aspirin EC 81 MG tablet Take 1 tablet (81 mg total) by mouth daily.    Marland Kitchen atorvastatin (LIPITOR) 10 MG tablet Take 10 mg by mouth daily.    . chlordiazePOXIDE-amitriptyline (LIMBITROL DS) 10-25 MG TABS tablet Take 1 tablet by mouth 2 (two) times daily.    Marland Kitchen docusate sodium (COLACE) 100 MG capsule Take 100 mg by mouth 2 (two) times daily.    . famotidine (PEPCID) 20 MG tablet TAKE ONE CAPSULE BY MOUTH TWICE DAILY 60 tablet 5  . glimepiride (AMARYL) 4 MG tablet Take 4 mg by mouth 2 (two) times daily before a meal.     . HYDROcodone-acetaminophen (NORCO) 10-325 MG tablet Take 1 tablet by mouth every 6 (six) hours as needed (pain).    Marland Kitchen lisinopril-hydrochlorothiazide (PRINZIDE,ZESTORETIC) 10-12.5 MG per tablet Take 1 tablet by mouth daily.      . metoCLOPramide (REGLAN) 5 MG tablet Take 1 tablet (5 mg total) by mouth 2 (two) times daily before a meal. 60 tablet 3  . Multiple Vitamin (MULTIVITAMIN WITH MINERALS) TABS tablet Take 1 tablet by mouth daily. ALIVE MULTIVITAMIN    . omeprazole (PRILOSEC) 40 MG capsule Take 1 capsule (40 mg total) by mouth daily. 30 capsule 3  . ondansetron (ZOFRAN-ODT) 4 MG disintegrating tablet TAKE 1 TABLET BY MOUTH EVERY 8 HOURS AS NEEDED FOR NAUSEA AND VOMITING 30 tablet 1  . oxyCODONE-acetaminophen (PERCOCET) 10-325 MG tablet  Take 1 tablet by mouth every 6 (six) hours as needed.    Marland Kitchen OZEMPIC, 0.25 OR 0.5 MG/DOSE, 2 MG/1.5ML SOPN Inject 0.25 mg into the muscle every Friday.     . pioglitazone (ACTOS) 30 MG tablet Take 30 mg by mouth every evening.   4  . tiZANidine (ZANAFLEX) 4 MG tablet Take 4 mg by mouth 3 (three) times daily as needed for muscle spasms.  2   No current facility-administered medications for this visit.     Objective: Vital signs in last 24 hours: There were no vitals taken for this visit.  Intake/Output from previous day: No intake/output data recorded. Intake/Output  this shift: @IOTHISSHIFT @   Physical Exam  Lab Results:  No results found for this or any previous visit (from the past 24 hour(s)).  BMET No results for input(s): NA, K, CL, CO2, GLUCOSE, BUN, CREATININE, CALCIUM in the last 72 hours. PT/INR No results for input(s): LABPROT, INR in the last 72 hours. ABG No results for input(s): PHART, HCO3 in the last 72 hours.  Invalid input(s): PCO2, PO2  Studies/Results: No results found.   Assessment/Plan: No problem-specific Assessment & Plan notes found for this encounter.   No orders of the defined types were placed in this encounter.    No orders of the defined types were placed in this encounter.    No follow-ups on file.    CC: ***     Irine Seal 11/30/2019 [phone]

## 2019-12-04 DIAGNOSIS — Z8781 Personal history of (healed) traumatic fracture: Secondary | ICD-10-CM | POA: Diagnosis not present

## 2019-12-04 DIAGNOSIS — I10 Essential (primary) hypertension: Secondary | ICD-10-CM | POA: Diagnosis not present

## 2019-12-04 DIAGNOSIS — Z299 Encounter for prophylactic measures, unspecified: Secondary | ICD-10-CM | POA: Diagnosis not present

## 2019-12-04 DIAGNOSIS — G4700 Insomnia, unspecified: Secondary | ICD-10-CM | POA: Diagnosis not present

## 2019-12-04 DIAGNOSIS — E1165 Type 2 diabetes mellitus with hyperglycemia: Secondary | ICD-10-CM | POA: Diagnosis not present

## 2019-12-21 DIAGNOSIS — E1165 Type 2 diabetes mellitus with hyperglycemia: Secondary | ICD-10-CM | POA: Diagnosis not present

## 2019-12-22 DIAGNOSIS — E119 Type 2 diabetes mellitus without complications: Secondary | ICD-10-CM | POA: Diagnosis not present

## 2019-12-22 DIAGNOSIS — Z9181 History of falling: Secondary | ICD-10-CM | POA: Diagnosis not present

## 2019-12-22 DIAGNOSIS — Z7982 Long term (current) use of aspirin: Secondary | ICD-10-CM | POA: Diagnosis not present

## 2019-12-22 DIAGNOSIS — Z79891 Long term (current) use of opiate analgesic: Secondary | ICD-10-CM | POA: Diagnosis not present

## 2019-12-22 DIAGNOSIS — Z7984 Long term (current) use of oral hypoglycemic drugs: Secondary | ICD-10-CM | POA: Diagnosis not present

## 2019-12-22 DIAGNOSIS — S72041D Displaced fracture of base of neck of right femur, subsequent encounter for closed fracture with routine healing: Secondary | ICD-10-CM | POA: Diagnosis not present

## 2019-12-22 DIAGNOSIS — I10 Essential (primary) hypertension: Secondary | ICD-10-CM | POA: Diagnosis not present

## 2019-12-24 DIAGNOSIS — S72001D Fracture of unspecified part of neck of right femur, subsequent encounter for closed fracture with routine healing: Secondary | ICD-10-CM | POA: Diagnosis not present

## 2019-12-31 DIAGNOSIS — M48061 Spinal stenosis, lumbar region without neurogenic claudication: Secondary | ICD-10-CM | POA: Diagnosis not present

## 2019-12-31 DIAGNOSIS — M47816 Spondylosis without myelopathy or radiculopathy, lumbar region: Secondary | ICD-10-CM | POA: Diagnosis not present

## 2020-01-02 DIAGNOSIS — S72041D Displaced fracture of base of neck of right femur, subsequent encounter for closed fracture with routine healing: Secondary | ICD-10-CM | POA: Diagnosis not present

## 2020-01-14 DIAGNOSIS — E1165 Type 2 diabetes mellitus with hyperglycemia: Secondary | ICD-10-CM | POA: Diagnosis not present

## 2020-01-14 DIAGNOSIS — N1832 Chronic kidney disease, stage 3b: Secondary | ICD-10-CM | POA: Diagnosis not present

## 2020-01-14 DIAGNOSIS — I739 Peripheral vascular disease, unspecified: Secondary | ICD-10-CM | POA: Diagnosis not present

## 2020-01-14 DIAGNOSIS — F419 Anxiety disorder, unspecified: Secondary | ICD-10-CM | POA: Diagnosis not present

## 2020-01-14 DIAGNOSIS — Z299 Encounter for prophylactic measures, unspecified: Secondary | ICD-10-CM | POA: Diagnosis not present

## 2020-01-14 DIAGNOSIS — I10 Essential (primary) hypertension: Secondary | ICD-10-CM | POA: Diagnosis not present

## 2020-01-20 DIAGNOSIS — E1165 Type 2 diabetes mellitus with hyperglycemia: Secondary | ICD-10-CM | POA: Diagnosis not present

## 2020-01-20 DIAGNOSIS — I10 Essential (primary) hypertension: Secondary | ICD-10-CM | POA: Diagnosis not present

## 2020-02-04 DIAGNOSIS — Z299 Encounter for prophylactic measures, unspecified: Secondary | ICD-10-CM | POA: Diagnosis not present

## 2020-02-04 DIAGNOSIS — R109 Unspecified abdominal pain: Secondary | ICD-10-CM | POA: Diagnosis not present

## 2020-02-04 DIAGNOSIS — Z1339 Encounter for screening examination for other mental health and behavioral disorders: Secondary | ICD-10-CM | POA: Diagnosis not present

## 2020-02-04 DIAGNOSIS — Z7189 Other specified counseling: Secondary | ICD-10-CM | POA: Diagnosis not present

## 2020-02-04 DIAGNOSIS — E7800 Pure hypercholesterolemia, unspecified: Secondary | ICD-10-CM | POA: Diagnosis not present

## 2020-02-04 DIAGNOSIS — Z1331 Encounter for screening for depression: Secondary | ICD-10-CM | POA: Diagnosis not present

## 2020-02-04 DIAGNOSIS — Z79899 Other long term (current) drug therapy: Secondary | ICD-10-CM | POA: Diagnosis not present

## 2020-02-04 DIAGNOSIS — Z0000 Encounter for general adult medical examination without abnormal findings: Secondary | ICD-10-CM | POA: Diagnosis not present

## 2020-02-04 DIAGNOSIS — Z6822 Body mass index (BMI) 22.0-22.9, adult: Secondary | ICD-10-CM | POA: Diagnosis not present

## 2020-02-04 DIAGNOSIS — R5383 Other fatigue: Secondary | ICD-10-CM | POA: Diagnosis not present

## 2020-02-06 DIAGNOSIS — S72001D Fracture of unspecified part of neck of right femur, subsequent encounter for closed fracture with routine healing: Secondary | ICD-10-CM | POA: Diagnosis not present

## 2020-02-20 DIAGNOSIS — E1165 Type 2 diabetes mellitus with hyperglycemia: Secondary | ICD-10-CM | POA: Diagnosis not present

## 2020-02-20 DIAGNOSIS — I10 Essential (primary) hypertension: Secondary | ICD-10-CM | POA: Diagnosis not present

## 2020-02-26 ENCOUNTER — Other Ambulatory Visit (INDEPENDENT_AMBULATORY_CARE_PROVIDER_SITE_OTHER): Payer: Self-pay | Admitting: Internal Medicine

## 2020-03-06 ENCOUNTER — Telehealth: Payer: Self-pay | Admitting: Urology

## 2020-03-06 NOTE — Telephone Encounter (Signed)
Pt called and requests a nurse call her regarding a possible infection.

## 2020-03-06 NOTE — Telephone Encounter (Signed)
Appt scheduled with pt.

## 2020-03-07 ENCOUNTER — Ambulatory Visit: Payer: PPO | Admitting: Urology

## 2020-03-07 ENCOUNTER — Encounter: Payer: Self-pay | Admitting: Urology

## 2020-03-07 ENCOUNTER — Other Ambulatory Visit: Payer: Self-pay

## 2020-03-07 ENCOUNTER — Other Ambulatory Visit (HOSPITAL_COMMUNITY)
Admission: AD | Admit: 2020-03-07 | Discharge: 2020-03-07 | Disposition: A | Discharge status: Home / Self Care | Payer: PPO | Source: Skilled Nursing Facility | Attending: Urology | Admitting: Urology

## 2020-03-07 VITALS — BP 114/68 | HR 101 | Temp 97.5°F | Ht 63.0 in | Wt 158.0 lb

## 2020-03-07 DIAGNOSIS — N390 Urinary tract infection, site not specified: Secondary | ICD-10-CM | POA: Insufficient documentation

## 2020-03-07 DIAGNOSIS — R310 Gross hematuria: Secondary | ICD-10-CM | POA: Diagnosis not present

## 2020-03-07 DIAGNOSIS — N3946 Mixed incontinence: Secondary | ICD-10-CM

## 2020-03-07 LAB — POCT URINALYSIS DIPSTICK
Bilirubin, UA: NEGATIVE
Glucose, UA: NEGATIVE
Ketones, UA: NEGATIVE
Nitrite, UA: NEGATIVE
Protein, UA: NEGATIVE
Spec Grav, UA: <=1.005 — AB (ref 1.010–1.025)
Urobilinogen, UA: 0.2 E.U./dL
pH, UA: 6 (ref 5.0–8.0)

## 2020-03-07 LAB — URINALYSIS, ROUTINE W REFLEX MICROSCOPIC
Bilirubin Urine: NEGATIVE
Glucose, UA: NEGATIVE mg/dL
Hgb urine dipstick: NEGATIVE
Ketones, ur: NEGATIVE mg/dL
Nitrite: NEGATIVE
Protein, ur: NEGATIVE mg/dL
Specific Gravity, Urine: 1.006 (ref 1.005–1.030)
WBC, UA: >50 WBC/hpf — ABNORMAL HIGH (ref 0–5)
pH: 5 (ref 5.0–8.0)

## 2020-03-07 LAB — BLADDER SCAN AMB NON-IMAGING: Scan Result: 74.1

## 2020-03-07 MED ORDER — SULFAMETHOXAZOLE-TRIMETHOPRIM 800-160 MG PO TABS: 1.0000 | ORAL_TABLET | Freq: Two times a day (BID) | ORAL | 0 refills | Status: DC

## 2020-03-07 NOTE — Assessment & Plan Note (Signed)
She has worsening MUI and I will reassess in the 3 weeks to see if treating the UTI helps.  If she has persistent incontinence, I will do an exam and treat accordingly.

## 2020-03-07 NOTE — Progress Notes (Signed)
Urological Symptom Review  Patient is experiencing the following symptoms: Frequent urination Get up at night to urinate Leakage of urine Vaginal bleeding (female only)   Review of Systems  Gastrointestinal (upper)  : Nausea Vomiting  Gastrointestinal (lower) : Negative for lower GI symptoms  Constitutional : Negative for symptoms  Skin: Skin rash/lesion  Eyes: Negative for eye symptoms  Ear/Nose/Throat : Sinus problems  Hematologic/Lymphatic: Negative for Hematologic/Lymphatic symptoms  Cardiovascular : Leg swelling  Respiratory : Negative for respiratory symptoms  Endocrine: Negative for endocrine symptoms  Musculoskeletal: Back pain  Neurological: Headache  Dizziness  Psychologic: Negative for psychiatric symptoms

## 2020-03-07 NOTE — Progress Notes (Signed)
Subjective: 1. Recurrent UTI   2. Gross hematuria   3. Mixed incontinence urge and stress      Wendy Fuller returns today in f/u and was last seen in 2016.   She is back with UTI symptoms today.  She had hematuria yesterday but that has cleared and the urine is dark today.  She had strep on a culture in 10/20.   She has a history of atrophic vaginitis with recurrent UTI's and incomplete emptying. She is no longer on estrace cream or tamsulosin.   She had been on nitrofurantion for suppression but not in some time. She has some  dysuria. She has daytime frequency but only nocturia x 1. She has urgency with UUI and  SUI.   She has had no flank pain or fever.  She is recovering from a right hip fracture and ORIF in 3/21.  ROS:  ROS  No Known Allergies  Past Medical History:  Diagnosis Date   Arthritis    Back pain    Cataract    Depression    Diabetes ([place])    x 3 yrs   Hypertension    Thyroid disease     Past Surgical History:  Procedure Laterality Date   ABDOMINAL HYSTERECTOMY     BALLOON DILATION N/A 12/21/2013   Procedure: BALLOON DILATION;  Surgeon: Rogene Houston, MD;  [facility];  Service: Endoscopy;  Laterality: N/A;   BIOPSY  06/28/2019   Procedure: BIOPSY;  Surgeon: Rogene Houston, MD;  [facility];  Service: Endoscopy;;  duodenum    CESAREAN SECTION     COLONOSCOPY N/A 06/28/2019   Procedure: COLONOSCOPY;  Surgeon: Rogene Houston, MD;  [facility];  Service: Endoscopy;  Laterality: N/A;   complete hyster     ESOPHAGEAL DILATION N/A 07/26/2018   Procedure: ESOPHAGEAL DILATION;  Surgeon: Rogene Houston, MD;  [facility];  Service: Endoscopy;  Laterality: N/A;   ESOPHAGOGASTRODUODENOSCOPY N/A 12/21/2013   Procedure: ESOPHAGOGASTRODUODENOSCOPY (EGD);  Surgeon: Rogene Houston, MD;  [facility];  Service: Endoscopy;  Laterality: N/A;  730   ESOPHAGOGASTRODUODENOSCOPY N/A 07/26/2018   Procedure:  ESOPHAGOGASTRODUODENOSCOPY (EGD);  Surgeon: Rogene Houston, MD;  [facility];  Service: Endoscopy;  Laterality: N/A;  7:30   ESOPHAGOGASTRODUODENOSCOPY N/A 06/28/2019   Procedure: ESOPHAGOGASTRODUODENOSCOPY (EGD);  Surgeon: Rogene Houston, MD;  [facility];  Service: Endoscopy;  Laterality: N/A;  200pm   ESOPHAGOGASTRODUODENOSCOPY (EGD) WITH ESOPHAGEAL DILATION N/A 08/01/2013   Procedure: ESOPHAGOGASTRODUODENOSCOPY (EGD) WITH ESOPHAGEAL DILATION;  Surgeon: Rogene Houston, MD;  [facility];  Service: Endoscopy;  Laterality: N/A;  345-moved to 100 Damani notified pt   EYE SURGERY     MALONEY DILATION N/A 12/21/2013   Procedure: MALONEY DILATION;  Surgeon: Rogene Houston, MD;  [facility];  Service: Endoscopy;  Laterality: N/A;   ORIF HIP FRACTURE Right 11/18/2019   POLYPECTOMY  06/28/2019   Procedure: POLYPECTOMY;  Surgeon: Rogene Houston, MD;  [facility];  Service: Endoscopy;;  rectosigmoid   SAVORY DILATION N/A 12/21/2013   Procedure: SAVORY DILATION;  Surgeon: Rogene Houston, MD;  [facility];  Service: Endoscopy;  Laterality: N/A;   SHOULDER SURGERY     THYROID SURGERY      Social History   Socioeconomic History   Marital status: Married    Spouse name: Not on file   Number of children: Not  on file   Years of education: Not on file   Highest education level: Not on file  Occupational History   Occupation: retired  Tobacco Use   Smoking status: Never Smoker   Smokeless tobacco: Never Used  Scientific laboratory technician Use: Never used  Substance and Sexual Activity   Alcohol use: No   Drug use: No   Sexual activity: Not Currently  Other Topics Concern   Not on file  Social History Narrative   Not on file   Social Determinants of Health   Financial Resource Strain:    Difficulty of Paying Living Expenses:   Food Insecurity:    Worried About Charity fundraiser in the Last Year:     Arboriculturist in the Last Year:   Transportation Needs:    Film/video editor (Medical):    Lack of Transportation (Non-Medical):   Physical Activity:    Days of Exercise per Week:    Minutes of Exercise per Session:   Stress:    Feeling of Stress :   Social Connections:    Frequency of Communication with Friends and Family:    Frequency of Social Gatherings with Friends and Family:    Attends Religious Services:    Active Member of Clubs or Organizations:    Attends Music therapist:    Marital Status:   Intimate Partner Violence:    Fear of Current or Ex-Partner:    Emotionally Abused:    Physically Abused:    Sexually Abused:     Family History  Problem Relation Age of Onset   Cancer Mother    Diabetes Mother    Hyperlipidemia Mother    Cancer Father    Colon cancer Father     Anti-infectives: Anti-infectives (From admission, onward)   Start     Dose/Rate Route Frequency Ordered Stop   03/07/20 0000  sulfamethoxazole-trimethoprim (BACTRIM DS) 800-160 MG tablet     Discontinue     1 tablet Oral Every 12 hours 03/07/20 1347        Current Outpatient Medications  Medication Sig Dispense Refill   alendronate (FOSAMAX) 70 MG tablet Take 70 mg by mouth every morning.     aspirin EC 81 MG tablet Take 1 tablet (81 mg total) by mouth daily.     atorvastatin (LIPITOR) 10 MG tablet Take 10 mg by mouth daily.     chlordiazePOXIDE-amitriptyline (LIMBITROL DS) 10-25 MG TABS tablet Take by mouth.     docusate sodium (COLACE) 100 MG capsule Take 100 mg by mouth 2 (two) times daily.     famotidine (PEPCID) 20 MG tablet TAKE ONE CAPSULE BY MOUTH TWICE DAILY 60 tablet 5   glimepiride (AMARYL) 4 MG tablet Take by mouth.     HYDROcodone-acetaminophen (NORCO/VICODIN) 5-325 MG tablet Take 1 tablet by mouth every 6 (six) hours as needed.     LINZESS 145 MCG CAPS capsule Take 145 mcg by mouth daily.     lisinopril-hydrochlorothiazide  (PRINZIDE,ZESTORETIC) 10-12.5 MG per tablet Take 1 tablet by mouth daily.       metoCLOPramide (REGLAN) 5 MG tablet Take 1 tablet (5 mg total) by mouth 2 (two) times daily before a meal. 60 tablet 3   Multiple Vitamin (MULTIVITAMIN WITH MINERALS) TABS tablet Take 1 tablet by mouth daily. ALIVE MULTIVITAMIN     Multiple Vitamins-Minerals (QC DAILY MULTIVIT/MULTIMINERAL) TABS Take by mouth.     omeprazole (PRILOSEC) 40 MG capsule Take by mouth.  ondansetron (ZOFRAN) 4 MG tablet Take 4 mg by mouth 3 (three) times daily as needed.     ondansetron (ZOFRAN-ODT) 4 MG disintegrating tablet TAKE 1 TABLET BY MOUTH EVERY 8 HOURS AS NEEDED FOR NAUSEA AND VOMITING 30 tablet 1   oxyCODONE-acetaminophen (PERCOCET) 10-325 MG tablet Take by mouth.     pioglitazone (ACTOS) 30 MG tablet Take by mouth.     Semaglutide,0.25 or 0.5MG /DOS, (OZEMPIC, 0.25 OR 0.5 MG/DOSE,) 2 MG/1.5ML SOPN INJECT 0.5 MG ONCE WEEKLY AS DIRECTED     tiZANidine (ZANAFLEX) 4 MG tablet TAKE 1 TABLET BY MOUTH EVERY 8 HOURS AS NEEDED (DO NOT EXCEED THREE DOSES IN 24 HOURS)     sulfamethoxazole-trimethoprim (BACTRIM DS) 800-160 MG tablet Take 1 tablet by mouth every 12 (twelve) hours. 14 tablet 0   No current facility-administered medications for this visit.     Objective: Vital signs in last 24 hours: BP 114/68    Pulse (!) 101    Temp (!) 97.5 F (36.4 C)    Ht 5\' 3"  (1.6 m)    Wt 158 lb (71.7 kg)    BMI 27.99 kg/m   Intake/Output from previous day: No intake/output data recorded. Intake/Output this shift: @IOTHISSHIFT @   Physical Exam  Lab Results:  Results for orders placed or performed in visit on 03/07/20 (from the past 24 hour(s))  POCT urinalysis dipstick     Status: Abnormal   Collection Time: 03/07/20  1:27 PM  Result Value Ref Range   Color, UA yellow    Clarity, UA     Glucose, UA Negative Negative   Bilirubin, UA neg    Ketones, UA neg    Spec Grav, UA <=1.005 (A) 1.010 - 1.025   Blood, UA hemo  trace    pH, UA 6.0 5.0 - 8.0   Protein, UA Negative Negative   Urobilinogen, UA 0.2 0.2 or 1.0 E.U./dL   Nitrite, UA neg    Leukocytes, UA Moderate (2+) (A) Negative   Appearance clear    Odor      BMET No results for input(s): NA, K, CL, CO2, GLUCOSE, BUN, CREATININE, CALCIUM in the last 72 hours. PT/INR No results for input(s): LABPROT, INR in the last 72 hours. ABG No results for input(s): PHART, HCO3 in the last 72 hours.  Invalid input(s): PCO2, PO2  Studies/Results: No results found.   Assessment/Plan: Recurrent UTI She appears to have a recurrent UTI with hematuria yesterday.  I will culture the urine today and start bactrim pending the culture.  If the culture is negative, she will need CT and cystoscopy.  I will have her return in about 3 weeks.   Mixed incontinence urge and stress She has worsening MUI and I will reassess in the 3 weeks to see if treating the UTI helps.  If she has persistent incontinence, I will do an exam and treat accordingly.    Meds ordered this encounter  Medications   sulfamethoxazole-trimethoprim (BACTRIM DS) 800-160 MG tablet    Sig: Take 1 tablet by mouth every 12 (twelve) hours.    Dispense:  14 tablet    Refill:  0     Orders Placed This Encounter  Procedures   Urine Culture    Standing Status:   Future    Standing Expiration Date:   04/07/2020   Urinalysis, Routine w reflex microscopic    Standing Status:   Future    Standing Expiration Date:   04/07/2020   POCT urinalysis dipstick  BLADDER SCAN AMB NON-IMAGING     Return in about 3 weeks (around 03/28/2020).    CC:  Dr. Jerene Bears.      Irine Seal 03/07/2020 [phone]

## 2020-03-07 NOTE — Assessment & Plan Note (Signed)
She appears to have a recurrent UTI with hematuria yesterday.  I will culture the urine today and start bactrim pending the culture.  If the culture is negative, she will need CT and cystoscopy.  I will have her return in about 3 weeks.

## 2020-03-10 LAB — URINE CULTURE: Culture: >=100000 — AB

## 2020-03-11 ENCOUNTER — Telehealth: Payer: Self-pay

## 2020-03-11 NOTE — Telephone Encounter (Signed)
Pt.notified

## 2020-03-11 NOTE — Telephone Encounter (Signed)
-----   Message from Irine Seal, MD sent at 03/10/2020  5:38 PM EDT ----- Elizebeth Koller bactrim ----- Message ----- From: Iris Pert, LPN Sent: [phone]/45/3646  10:17 AM EDT To: Irine Seal, MD  Please review

## 2020-03-21 DIAGNOSIS — I10 Essential (primary) hypertension: Secondary | ICD-10-CM | POA: Diagnosis not present

## 2020-03-21 DIAGNOSIS — E1165 Type 2 diabetes mellitus with hyperglycemia: Secondary | ICD-10-CM | POA: Diagnosis not present

## 2020-03-25 DIAGNOSIS — E1165 Type 2 diabetes mellitus with hyperglycemia: Secondary | ICD-10-CM | POA: Diagnosis not present

## 2020-03-25 DIAGNOSIS — I10 Essential (primary) hypertension: Secondary | ICD-10-CM | POA: Diagnosis not present

## 2020-04-02 ENCOUNTER — Ambulatory Visit (INDEPENDENT_AMBULATORY_CARE_PROVIDER_SITE_OTHER): Payer: PPO | Admitting: Gastroenterology

## 2020-04-03 NOTE — Progress Notes (Deleted)
Subjective: 1. Recurrent UTI      Wendy Fuller returns today in f/u and was last seen in 2016.   She is back with UTI symptoms today.  She had hematuria yesterday but that has cleared and the urine is dark today.  She had strep on a culture in 10/20.   She has a history of atrophic vaginitis with recurrent UTI's and incomplete emptying. She is no longer on estrace cream or tamsulosin.   She had been on nitrofurantion for suppression but not in some time. She has some  dysuria. She has daytime frequency but only nocturia x 1. She has urgency with UUI and  SUI.   She has had no flank pain or fever.  She is recovering from a right hip fracture and ORIF in 3/21.  ROS:  ROS  No Known Allergies  Past Medical History:  Diagnosis Date  . Arthritis   . Back pain   . Cataract   . Depression   . Diabetes ([place])    x 3 yrs  . Hypertension   . Thyroid disease     Past Surgical History:  Procedure Laterality Date  . ABDOMINAL HYSTERECTOMY    . BALLOON DILATION N/A 12/21/2013   Procedure: BALLOON DILATION;  Surgeon: Rogene Houston, MD;  [facility];  Service: Endoscopy;  Laterality: N/A;  . BIOPSY  06/28/2019   Procedure: BIOPSY;  Surgeon: Rogene Houston, MD;  [facility];  Service: Endoscopy;;  duodenum   . CESAREAN SECTION    . COLONOSCOPY N/A 06/28/2019   Procedure: COLONOSCOPY;  Surgeon: Rogene Houston, MD;  [facility];  Service: Endoscopy;  Laterality: N/A;  . complete hyster    . ESOPHAGEAL DILATION N/A 07/26/2018   Procedure: ESOPHAGEAL DILATION;  Surgeon: Rogene Houston, MD;  [facility];  Service: Endoscopy;  Laterality: N/A;  . ESOPHAGOGASTRODUODENOSCOPY N/A 12/21/2013   Procedure: ESOPHAGOGASTRODUODENOSCOPY (EGD);  Surgeon: Rogene Houston, MD;  [facility];  Service: Endoscopy;  Laterality: N/A;  730  . ESOPHAGOGASTRODUODENOSCOPY N/A 07/26/2018   Procedure: ESOPHAGOGASTRODUODENOSCOPY (EGD);  Surgeon: Rogene Houston, MD;   [facility];  Service: Endoscopy;  Laterality: N/A;  7:30  . ESOPHAGOGASTRODUODENOSCOPY N/A 06/28/2019   Procedure: ESOPHAGOGASTRODUODENOSCOPY (EGD);  Surgeon: Rogene Houston, MD;  [facility];  Service: Endoscopy;  Laterality: N/A;  200pm  . ESOPHAGOGASTRODUODENOSCOPY (EGD) WITH ESOPHAGEAL DILATION N/A 08/01/2013   Procedure: ESOPHAGOGASTRODUODENOSCOPY (EGD) WITH ESOPHAGEAL DILATION;  Surgeon: Rogene Houston, MD;  [facility];  Service: Endoscopy;  Laterality: N/A;  345-moved to 100 Diesha notified pt  . EYE SURGERY    . MALONEY DILATION N/A 12/21/2013   Procedure: Venia Minks DILATION;  Surgeon: Rogene Houston, MD;  [facility];  Service: Endoscopy;  Laterality: N/A;  . ORIF HIP FRACTURE Right 11/18/2019  . POLYPECTOMY  06/28/2019   Procedure: POLYPECTOMY;  Surgeon: Rogene Houston, MD;  [facility];  Service: Endoscopy;;  rectosigmoid  . SAVORY DILATION N/A 12/21/2013   Procedure: SAVORY DILATION;  Surgeon: Rogene Houston, MD;  [facility];  Service: Endoscopy;  Laterality: N/A;  . SHOULDER SURGERY    . THYROID SURGERY      Social History   Socioeconomic History  . Marital status: Married    Spouse name: Not on file  . Number of children: Not on file  . Years of education: Not on file  . Highest  education level: Not on file  Occupational History  . Occupation: retired  Tobacco Use  . Smoking status: Never Smoker  . Smokeless tobacco: Never Used  Vaping Use  . Vaping Use: Never used  Substance and Sexual Activity  . Alcohol use: No  . Drug use: No  . Sexual activity: Not Currently  Other Topics Concern  . Not on file  Social History Narrative  . Not on file   Social Determinants of Health   Financial Resource Strain:   . Difficulty of Paying Living Expenses:   Food Insecurity:   . Worried About Charity fundraiser in the Last Year:   . Arboriculturist in the Last Year:   Transportation Needs:   . Consulting civil engineer (Medical):   Marland Kitchen Lack of Transportation (Non-Medical):   Physical Activity:   . Days of Exercise per Week:   . Minutes of Exercise per Session:   Stress:   . Feeling of Stress :   Social Connections:   . Frequency of Communication with Friends and Family:   . Frequency of Social Gatherings with Friends and Family:   . Attends Religious Services:   . Active Member of Clubs or Organizations:   . Attends Archivist Meetings:   Marland Kitchen Marital Status:   Intimate Partner Violence:   . Fear of Current or Ex-Partner:   . Emotionally Abused:   Marland Kitchen Physically Abused:   . Sexually Abused:     Family History  Problem Relation Age of Onset  . Cancer Mother   . Diabetes Mother   . Hyperlipidemia Mother   . Cancer Father   . Colon cancer Father     Anti-infectives: Anti-infectives (From admission, onward)   None      Current Outpatient Medications  Medication Sig Dispense Refill  . alendronate (FOSAMAX) 70 MG tablet Take 70 mg by mouth every morning.    Marland Kitchen aspirin EC 81 MG tablet Take 1 tablet (81 mg total) by mouth daily.    Marland Kitchen atorvastatin (LIPITOR) 10 MG tablet Take 10 mg by mouth daily.    . chlordiazePOXIDE-amitriptyline (LIMBITROL DS) 10-25 MG TABS tablet Take by mouth.    . docusate sodium (COLACE) 100 MG capsule Take 100 mg by mouth 2 (two) times daily.    . famotidine (PEPCID) 20 MG tablet TAKE ONE CAPSULE BY MOUTH TWICE DAILY 60 tablet 5  . glimepiride (AMARYL) 4 MG tablet Take by mouth.    Marland Kitchen HYDROcodone-acetaminophen (NORCO/VICODIN) 5-325 MG tablet Take 1 tablet by mouth every 6 (six) hours as needed.    Marland Kitchen LINZESS 145 MCG CAPS capsule Take 145 mcg by mouth daily.    Marland Kitchen lisinopril-hydrochlorothiazide (PRINZIDE,ZESTORETIC) 10-12.5 MG per tablet Take 1 tablet by mouth daily.      . metoCLOPramide (REGLAN) 5 MG tablet Take 1 tablet (5 mg total) by mouth 2 (two) times daily before a meal. 60 tablet 3  . Multiple Vitamin (MULTIVITAMIN WITH MINERALS) TABS tablet  Take 1 tablet by mouth daily. ALIVE MULTIVITAMIN    . Multiple Vitamins-Minerals (QC DAILY MULTIVIT/MULTIMINERAL) TABS Take by mouth.    Marland Kitchen omeprazole (PRILOSEC) 40 MG capsule Take by mouth.    . ondansetron (ZOFRAN) 4 MG tablet Take 4 mg by mouth 3 (three) times daily as needed.    . ondansetron (ZOFRAN-ODT) 4 MG disintegrating tablet TAKE 1 TABLET BY MOUTH EVERY 8 HOURS AS NEEDED FOR NAUSEA AND VOMITING 30 tablet 1  . oxyCODONE-acetaminophen (PERCOCET) 10-325 MG tablet  Take by mouth.    . pioglitazone (ACTOS) 30 MG tablet Take by mouth.    . Semaglutide,0.25 or 0.5MG /DOS, (OZEMPIC, 0.25 OR 0.5 MG/DOSE,) 2 MG/1.5ML SOPN INJECT 0.5 MG ONCE WEEKLY AS DIRECTED    . sulfamethoxazole-trimethoprim (BACTRIM DS) 800-160 MG tablet Take 1 tablet by mouth every 12 (twelve) hours. 14 tablet 0  . tiZANidine (ZANAFLEX) 4 MG tablet TAKE 1 TABLET BY MOUTH EVERY 8 HOURS AS NEEDED (DO NOT EXCEED THREE DOSES IN 24 HOURS)     No current facility-administered medications for this visit.     Objective: Vital signs in last 24 hours: There were no vitals taken for this visit.  Intake/Output from previous day: No intake/output data recorded. Intake/Output this shift: @IOTHISSHIFT @   Physical Exam  Lab Results:  No results found for this or any previous visit (from the past 24 hour(s)).  BMET No results for input(s): NA, K, CL, CO2, GLUCOSE, BUN, CREATININE, CALCIUM in the last 72 hours. PT/INR No results for input(s): LABPROT, INR in the last 72 hours. ABG No results for input(s): PHART, HCO3 in the last 72 hours.  Invalid input(s): PCO2, PO2  Studies/Results: No results found.   Assessment/Plan: No problem-specific Assessment & Plan notes found for this encounter.   No orders of the defined types were placed in this encounter.    No orders of the defined types were placed in this encounter.    No follow-ups on file.    CC:  Dr. Jerene Bears.      Irine Seal 04/03/2020 [phone]

## 2020-04-04 ENCOUNTER — Ambulatory Visit: Payer: PPO | Admitting: Urology

## 2020-04-10 NOTE — Progress Notes (Deleted)
Subjective: No diagnosis found.   Wendy Fuller returns today in f/u and was last seen in 2016.   She is back with UTI symptoms today.  She had hematuria yesterday but that has cleared and the urine is dark today.  She had strep on a culture in 10/20.   She has a history of atrophic vaginitis with recurrent UTI's and incomplete emptying. She is no longer on estrace cream or tamsulosin.   She had been on nitrofurantion for suppression but not in some time. She has some  dysuria. She has daytime frequency but only nocturia x 1. She has urgency with UUI and  SUI.   She has had no flank pain or fever.  She is recovering from a right hip fracture and ORIF in 3/21.  ROS:  ROS  No Known Allergies  Past Medical History:  Diagnosis Date   Arthritis    Back pain    Cataract    Depression    Diabetes ([place])    x 3 yrs   Hypertension    Thyroid disease     Past Surgical History:  Procedure Laterality Date   ABDOMINAL HYSTERECTOMY     BALLOON DILATION N/A 12/21/2013   Procedure: BALLOON DILATION;  Surgeon: Rogene Houston, MD;  [facility];  Service: Endoscopy;  Laterality: N/A;   BIOPSY  06/28/2019   Procedure: BIOPSY;  Surgeon: Rogene Houston, MD;  [facility];  Service: Endoscopy;;  duodenum    CESAREAN SECTION     COLONOSCOPY N/A 06/28/2019   Procedure: COLONOSCOPY;  Surgeon: Rogene Houston, MD;  [facility];  Service: Endoscopy;  Laterality: N/A;   complete hyster     ESOPHAGEAL DILATION N/A 07/26/2018   Procedure: ESOPHAGEAL DILATION;  Surgeon: Rogene Houston, MD;  [facility];  Service: Endoscopy;  Laterality: N/A;   ESOPHAGOGASTRODUODENOSCOPY N/A 12/21/2013   Procedure: ESOPHAGOGASTRODUODENOSCOPY (EGD);  Surgeon: Rogene Houston, MD;  [facility];  Service: Endoscopy;  Laterality: N/A;  730   ESOPHAGOGASTRODUODENOSCOPY N/A 07/26/2018   Procedure: ESOPHAGOGASTRODUODENOSCOPY (EGD);  Surgeon: Rogene Houston, MD;   [facility];  Service: Endoscopy;  Laterality: N/A;  7:30   ESOPHAGOGASTRODUODENOSCOPY N/A 06/28/2019   Procedure: ESOPHAGOGASTRODUODENOSCOPY (EGD);  Surgeon: Rogene Houston, MD;  [facility];  Service: Endoscopy;  Laterality: N/A;  200pm   ESOPHAGOGASTRODUODENOSCOPY (EGD) WITH ESOPHAGEAL DILATION N/A 08/01/2013   Procedure: ESOPHAGOGASTRODUODENOSCOPY (EGD) WITH ESOPHAGEAL DILATION;  Surgeon: Rogene Houston, MD;  [facility];  Service: Endoscopy;  Laterality: N/A;  345-moved to 100 [place] notified pt   EYE SURGERY     MALONEY DILATION N/A 12/21/2013   Procedure: MALONEY DILATION;  Surgeon: Rogene Houston, MD;  [facility];  Service: Endoscopy;  Laterality: N/A;   ORIF HIP FRACTURE Right 11/18/2019   POLYPECTOMY  06/28/2019   Procedure: POLYPECTOMY;  Surgeon: Rogene Houston, MD;  [facility];  Service: Endoscopy;;  rectosigmoid   SAVORY DILATION N/A 12/21/2013   Procedure: SAVORY DILATION;  Surgeon: Rogene Houston, MD;  [facility];  Service: Endoscopy;  Laterality: N/A;   SHOULDER SURGERY     THYROID SURGERY      Social History   Socioeconomic History   Marital status: Married    Spouse name: Not on file   Number of children: Not on file   Years of education: Not on file   Highest education level: Not  on file  Occupational History   Occupation: retired  Tobacco Use   Smoking status: Never Smoker   Smokeless tobacco: Never Used  Scientific laboratory technician Use: Never used  Substance and Sexual Activity   Alcohol use: No   Drug use: No   Sexual activity: Not Currently  Other Topics Concern   Not on file  Social History Narrative   Not on file   Social Determinants of Health   Financial Resource Strain:    Difficulty of Paying Living Expenses: Not on file  Food Insecurity:    Worried About Charity fundraiser in the Last Year: Not on file   [employer] of Food in the Last Year: Not on file   Transportation Needs:    Lack of Transportation (Medical): Not on file   Lack of Transportation (Non-Medical): Not on file  Physical Activity:    Days of Exercise per Week: Not on file   Minutes of Exercise per Session: Not on file  Stress:    Feeling of Stress : Not on file  Social Connections:    Frequency of Communication with Friends and Family: Not on file   Frequency of Social Gatherings with Friends and Family: Not on file   Attends Religious Services: Not on file   Active Member of Clubs or Organizations: Not on file   Attends Archivist Meetings: Not on file   Marital Status: Not on file  Intimate Partner Violence:    Fear of Current or Ex-Partner: Not on file   Emotionally Abused: Not on file   Physically Abused: Not on file   Sexually Abused: Not on file    Family History  Problem Relation Age of Onset   Cancer Mother    Diabetes Mother    Hyperlipidemia Mother    Cancer Father    Colon cancer Father     Anti-infectives: Anti-infectives (From admission, onward)   None      Current Outpatient Medications  Medication Sig Dispense Refill   alendronate (FOSAMAX) 70 MG tablet Take 70 mg by mouth every morning.     aspirin EC 81 MG tablet Take 1 tablet (81 mg total) by mouth daily.     atorvastatin (LIPITOR) 10 MG tablet Take 10 mg by mouth daily.     chlordiazePOXIDE-amitriptyline (LIMBITROL DS) 10-25 MG TABS tablet Take by mouth.     docusate sodium (COLACE) 100 MG capsule Take 100 mg by mouth 2 (two) times daily.     famotidine (PEPCID) 20 MG tablet TAKE ONE CAPSULE BY MOUTH TWICE DAILY 60 tablet 5   glimepiride (AMARYL) 4 MG tablet Take by mouth.     HYDROcodone-acetaminophen (NORCO/VICODIN) 5-325 MG tablet Take 1 tablet by mouth every 6 (six) hours as needed.     LINZESS 145 MCG CAPS capsule Take 145 mcg by mouth daily.     lisinopril-hydrochlorothiazide (PRINZIDE,ZESTORETIC) 10-12.5 MG per tablet Take 1 tablet by  mouth daily.       metoCLOPramide (REGLAN) 5 MG tablet Take 1 tablet (5 mg total) by mouth 2 (two) times daily before a meal. 60 tablet 3   Multiple Vitamin (MULTIVITAMIN WITH MINERALS) TABS tablet Take 1 tablet by mouth daily. ALIVE MULTIVITAMIN     Multiple Vitamins-Minerals (QC DAILY MULTIVIT/MULTIMINERAL) TABS Take by mouth.     omeprazole (PRILOSEC) 40 MG capsule Take by mouth.     ondansetron (ZOFRAN) 4 MG tablet Take 4 mg by mouth 3 (three) times daily as needed.  ondansetron (ZOFRAN-ODT) 4 MG disintegrating tablet TAKE 1 TABLET BY MOUTH EVERY 8 HOURS AS NEEDED FOR NAUSEA AND VOMITING 30 tablet 1   oxyCODONE-acetaminophen (PERCOCET) 10-325 MG tablet Take by mouth.     pioglitazone (ACTOS) 30 MG tablet Take by mouth.     Semaglutide,0.25 or 0.5MG /DOS, (OZEMPIC, 0.25 OR 0.5 MG/DOSE,) 2 MG/1.5ML SOPN INJECT 0.5 MG ONCE WEEKLY AS DIRECTED     sulfamethoxazole-trimethoprim (BACTRIM DS) 800-160 MG tablet Take 1 tablet by mouth every 12 (twelve) hours. 14 tablet 0   tiZANidine (ZANAFLEX) 4 MG tablet TAKE 1 TABLET BY MOUTH EVERY 8 HOURS AS NEEDED (DO NOT EXCEED THREE DOSES IN 24 HOURS)     No current facility-administered medications for this visit.     Objective: Vital signs in last 24 hours: There were no vitals taken for this visit.  Intake/Output from previous day: No intake/output data recorded. Intake/Output this shift: @IOTHISSHIFT @   Physical Exam  Lab Results:  No results found for this or any previous visit (from the past 24 hour(s)).  BMET No results for input(s): NA, K, CL, CO2, GLUCOSE, BUN, CREATININE, CALCIUM in the last 72 hours. PT/INR No results for input(s): LABPROT, INR in the last 72 hours. ABG No results for input(s): PHART, HCO3 in the last 72 hours.  Invalid input(s): PCO2, PO2  Studies/Results: No results found.   Assessment/Plan: No problem-specific Assessment & Plan notes found for this encounter.   No orders of the defined types  were placed in this encounter.    No orders of the defined types were placed in this encounter.    No follow-ups on file.    CC:  Dr. Jerene Bears.      Irine Seal 04/10/2020 [phone]

## 2020-04-11 ENCOUNTER — Ambulatory Visit: Payer: PPO | Admitting: Urology

## 2020-04-14 ENCOUNTER — Telehealth: Payer: Self-pay

## 2020-04-14 NOTE — Telephone Encounter (Signed)
Can she come and leave a urine for culture.  I hate for her to wait until Friday if she has an active infection, or she could get worked in with Wendy Fuller or Wendy Fuller if they have room tomorrow.

## 2020-04-15 ENCOUNTER — Other Ambulatory Visit: Payer: Self-pay

## 2020-04-15 DIAGNOSIS — N390 Urinary tract infection, site not specified: Secondary | ICD-10-CM

## 2020-04-15 NOTE — Telephone Encounter (Signed)
Pt. Is coming in tomorrow to leave spec.

## 2020-04-16 ENCOUNTER — Other Ambulatory Visit: Payer: Self-pay

## 2020-04-16 ENCOUNTER — Other Ambulatory Visit: Payer: PPO

## 2020-04-16 DIAGNOSIS — N390 Urinary tract infection, site not specified: Secondary | ICD-10-CM | POA: Diagnosis not present

## 2020-04-20 LAB — CULTURE, URINE COMPREHENSIVE

## 2020-04-22 ENCOUNTER — Telehealth: Payer: Self-pay

## 2020-04-22 DIAGNOSIS — I10 Essential (primary) hypertension: Secondary | ICD-10-CM | POA: Diagnosis not present

## 2020-04-22 DIAGNOSIS — E1165 Type 2 diabetes mellitus with hyperglycemia: Secondary | ICD-10-CM | POA: Diagnosis not present

## 2020-04-22 DIAGNOSIS — I739 Peripheral vascular disease, unspecified: Secondary | ICD-10-CM | POA: Diagnosis not present

## 2020-04-22 DIAGNOSIS — Z299 Encounter for prophylactic measures, unspecified: Secondary | ICD-10-CM | POA: Diagnosis not present

## 2020-04-22 DIAGNOSIS — D492 Neoplasm of unspecified behavior of bone, soft tissue, and skin: Secondary | ICD-10-CM | POA: Diagnosis not present

## 2020-04-22 DIAGNOSIS — N390 Urinary tract infection, site not specified: Secondary | ICD-10-CM | POA: Diagnosis not present

## 2020-04-22 NOTE — Telephone Encounter (Signed)
-----   Message from Irine Seal, MD sent at 04/22/2020  8:59 AM EDT ----- Regarding: Negative urine culture. Her urine culture is negative.

## 2020-04-22 NOTE — Telephone Encounter (Signed)
Pt called. No answer. No way to leave message.

## 2020-04-30 ENCOUNTER — Other Ambulatory Visit (INDEPENDENT_AMBULATORY_CARE_PROVIDER_SITE_OTHER): Payer: Self-pay | Admitting: Gastroenterology

## 2020-05-01 NOTE — Progress Notes (Deleted)
Subjective: 1. Mixed incontinence urge and stress      Wendy Fuller returns today in f/u and was last seen in 2016.   She is back with UTI symptoms today.  She had hematuria yesterday but that has cleared and the urine is dark today.  She had strep on a culture in 10/20.   She has a history of atrophic vaginitis with recurrent UTI's and incomplete emptying. She is no longer on estrace cream or tamsulosin.   She had been on nitrofurantion for suppression but not in some time. She has some  dysuria. She has daytime frequency but only nocturia x 1. She has urgency with UUI and  SUI.   She has had no flank pain or fever.  She is recovering from a right hip fracture and ORIF in 3/21.  ROS:  ROS  No Known Allergies  Past Medical History:  Diagnosis Date  . Arthritis   . Back pain   . Cataract   . Depression   . Diabetes ([place])    x 3 yrs  . Hypertension   . Thyroid disease     Past Surgical History:  Procedure Laterality Date  . ABDOMINAL HYSTERECTOMY    . BALLOON DILATION N/A 12/21/2013   Procedure: BALLOON DILATION;  Surgeon: Rogene Houston, MD;  [facility];  Service: Endoscopy;  Laterality: N/A;  . BIOPSY  06/28/2019   Procedure: BIOPSY;  Surgeon: Rogene Houston, MD;  [facility];  Service: Endoscopy;;  duodenum   . CESAREAN SECTION    . COLONOSCOPY N/A 06/28/2019   Procedure: COLONOSCOPY;  Surgeon: Rogene Houston, MD;  [facility];  Service: Endoscopy;  Laterality: N/A;  . complete hyster    . ESOPHAGEAL DILATION N/A 07/26/2018   Procedure: ESOPHAGEAL DILATION;  Surgeon: Rogene Houston, MD;  [facility];  Service: Endoscopy;  Laterality: N/A;  . ESOPHAGOGASTRODUODENOSCOPY N/A 12/21/2013   Procedure: ESOPHAGOGASTRODUODENOSCOPY (EGD);  Surgeon: Rogene Houston, MD;  [facility];  Service: Endoscopy;  Laterality: N/A;  730  . ESOPHAGOGASTRODUODENOSCOPY N/A 07/26/2018   Procedure: ESOPHAGOGASTRODUODENOSCOPY (EGD);  Surgeon:  Rogene Houston, MD;  [facility];  Service: Endoscopy;  Laterality: N/A;  7:30  . ESOPHAGOGASTRODUODENOSCOPY N/A 06/28/2019   Procedure: ESOPHAGOGASTRODUODENOSCOPY (EGD);  Surgeon: Rogene Houston, MD;  [facility];  Service: Endoscopy;  Laterality: N/A;  200pm  . ESOPHAGOGASTRODUODENOSCOPY (EGD) WITH ESOPHAGEAL DILATION N/A 08/01/2013   Procedure: ESOPHAGOGASTRODUODENOSCOPY (EGD) WITH ESOPHAGEAL DILATION;  Surgeon: Rogene Houston, MD;  [facility];  Service: Endoscopy;  Laterality: N/A;  345-moved to 100 [place] notified pt  . EYE SURGERY    . MALONEY DILATION N/A 12/21/2013   Procedure: Venia Minks DILATION;  Surgeon: Rogene Houston, MD;  [facility];  Service: Endoscopy;  Laterality: N/A;  . ORIF HIP FRACTURE Right 11/18/2019  . POLYPECTOMY  06/28/2019   Procedure: POLYPECTOMY;  Surgeon: Rogene Houston, MD;  [facility];  Service: Endoscopy;;  rectosigmoid  . SAVORY DILATION N/A 12/21/2013   Procedure: SAVORY DILATION;  Surgeon: Rogene Houston, MD;  [facility];  Service: Endoscopy;  Laterality: N/A;  . SHOULDER SURGERY    . THYROID SURGERY      Social History   Socioeconomic History  . Marital status: Married    Spouse name: Not on file  . Number of children: Not on file  . Years of education: Not on file  .  Highest education level: Not on file  Occupational History  . Occupation: retired  Tobacco Use  . Smoking status: Never Smoker  . Smokeless tobacco: Never Used  Vaping Use  . Vaping Use: Never used  Substance and Sexual Activity  . Alcohol use: No  . Drug use: No  . Sexual activity: Not Currently  Other Topics Concern  . Not on file  Social History Narrative  . Not on file   Social Determinants of Health   Financial Resource Strain:   . Difficulty of Paying Living Expenses: Not on file  Food Insecurity:   . Worried About Charity fundraiser in the Last Year: Not on file  . Ran Out of Food in the  Last Year: Not on file  Transportation Needs:   . Lack of Transportation (Medical): Not on file  . Lack of Transportation (Non-Medical): Not on file  Physical Activity:   . Days of Exercise per Week: Not on file  . Minutes of Exercise per Session: Not on file  Stress:   . Feeling of Stress : Not on file  Social Connections:   . Frequency of Communication with Friends and Family: Not on file  . Frequency of Social Gatherings with Friends and Family: Not on file  . Attends Religious Services: Not on file  . Active Member of Clubs or Organizations: Not on file  . Attends Archivist Meetings: Not on file  . Marital Status: Not on file  Intimate Partner Violence:   . Fear of Current or Ex-Partner: Not on file  . Emotionally Abused: Not on file  . Physically Abused: Not on file  . Sexually Abused: Not on file    Family History  Problem Relation Age of Onset  . Cancer Mother   . Diabetes Mother   . Hyperlipidemia Mother   . Cancer Father   . Colon cancer Father     Anti-infectives: Anti-infectives (From admission, onward)   None      Current Outpatient Medications  Medication Sig Dispense Refill  . alendronate (FOSAMAX) 70 MG tablet Take 70 mg by mouth every morning.    Marland Kitchen aspirin EC 81 MG tablet Take 1 tablet (81 mg total) by mouth daily.    Marland Kitchen atorvastatin (LIPITOR) 10 MG tablet Take 10 mg by mouth daily.    . chlordiazePOXIDE-amitriptyline (LIMBITROL DS) 10-25 MG TABS tablet Take by mouth.    . docusate sodium (COLACE) 100 MG capsule Take 100 mg by mouth 2 (two) times daily.    . famotidine (PEPCID) 20 MG tablet TAKE ONE CAPSULE BY MOUTH TWICE DAILY 60 tablet 5  . glimepiride (AMARYL) 4 MG tablet Take by mouth.    Marland Kitchen HYDROcodone-acetaminophen (NORCO/VICODIN) 5-325 MG tablet Take 1 tablet by mouth every 6 (six) hours as needed.    Marland Kitchen LINZESS 145 MCG CAPS capsule Take 145 mcg by mouth daily.    Marland Kitchen lisinopril-hydrochlorothiazide (PRINZIDE,ZESTORETIC) 10-12.5 MG per  tablet Take 1 tablet by mouth daily.      . metoCLOPramide (REGLAN) 5 MG tablet Take 1 tablet (5 mg total) by mouth 2 (two) times daily before a meal. 60 tablet 3  . Multiple Vitamin (MULTIVITAMIN WITH MINERALS) TABS tablet Take 1 tablet by mouth daily. ALIVE MULTIVITAMIN    . Multiple Vitamins-Minerals (QC DAILY MULTIVIT/MULTIMINERAL) TABS Take by mouth.    Marland Kitchen omeprazole (PRILOSEC) 40 MG capsule Take by mouth.    . ondansetron (ZOFRAN) 4 MG tablet Take 4 mg by mouth 3 (three) times  daily as needed.    . ondansetron (ZOFRAN-ODT) 4 MG disintegrating tablet TAKE 1 TABLET BY MOUTH EVERY 8 HOURS AS NEEDED FOR NAUSEA AND VOMITING 30 tablet 1  . oxyCODONE-acetaminophen (PERCOCET) 10-325 MG tablet Take by mouth.    . pioglitazone (ACTOS) 30 MG tablet Take by mouth.    . Semaglutide,0.25 or 0.5MG /DOS, (OZEMPIC, 0.25 OR 0.5 MG/DOSE,) 2 MG/1.5ML SOPN INJECT 0.5 MG ONCE WEEKLY AS DIRECTED    . sulfamethoxazole-trimethoprim (BACTRIM DS) 800-160 MG tablet Take 1 tablet by mouth every 12 (twelve) hours. 14 tablet 0  . tiZANidine (ZANAFLEX) 4 MG tablet TAKE 1 TABLET BY MOUTH EVERY 8 HOURS AS NEEDED (DO NOT EXCEED THREE DOSES IN 24 HOURS)     No current facility-administered medications for this visit.     Objective: Vital signs in last 24 hours: There were no vitals taken for this visit.  Intake/Output from previous day: No intake/output data recorded. Intake/Output this shift: @IOTHISSHIFT @   Physical Exam  Lab Results:  No results found for this or any previous visit (from the past 24 hour(s)).  BMET No results for input(s): NA, K, CL, CO2, GLUCOSE, BUN, CREATININE, CALCIUM in the last 72 hours. PT/INR No results for input(s): LABPROT, INR in the last 72 hours. ABG No results for input(s): PHART, HCO3 in the last 72 hours.  Invalid input(s): PCO2, PO2  Studies/Results: No results found.   Assessment/Plan: No problem-specific Assessment & Plan notes found for this encounter.   No  orders of the defined types were placed in this encounter.    No orders of the defined types were placed in this encounter.    No follow-ups on file.    CC:  Dr. Jerene Bears.      Irine Seal 05/01/2020 [phone]

## 2020-05-02 ENCOUNTER — Ambulatory Visit: Payer: PPO | Admitting: Urology

## 2020-05-07 ENCOUNTER — Telehealth: Payer: Self-pay | Admitting: Urology

## 2020-05-07 NOTE — Telephone Encounter (Signed)
Pt was scheduled for 2 days from now on Friday. I called pt and notified her. She said she had another appt at that time. I asked if she could change it because we had worked her in and it would be a long time before we would have more availability. Pt said she was having a hard time keeping up with dates now. I know she had missed an appt here last week so I decided to call her drt and also let her know circumstances. She said she would have pt here on time.

## 2020-05-07 NOTE — Telephone Encounter (Signed)
Patients daughter called, stating they missed appt last 2022-12-16 due to a death in the family and they forgot. She states pt needs to see Dr Jeffie Pollock asap because of her ongoing issues. Please advise.

## 2020-05-08 NOTE — Progress Notes (Signed)
Subjective: 1. Mixed incontinence urge and stress   2. Recurrent UTI   3. Vaginal atrophy     05/09/20: Wendy Fuller returns in f/u.  She was seen in July and was found to have citrobacter that was treated.   She didn't have improvement in her incontinence with antibiotic therapy.  She lost her husband about 3 weeks ago after a battle with COVID.  She is using 1ppd.  It is variably wet.  She has MUI.   She feels she is emptying her bladder.  She has some vaginal irritation.    03/07/20: Wendy Fuller returns today in f/u and was last seen in 2016.   She is back with UTI symptoms today.  She had hematuria yesterday but that has cleared and the urine is dark today.  She had strep on a culture in 10/20.   She has a history of atrophic vaginitis with recurrent UTI's and incomplete emptying. She is no longer on estrace cream or tamsulosin.   She had been on nitrofurantion for suppression but not in some time. She has some  dysuria. She has daytime frequency but only nocturia x 1. She has urgency with UUI and  SUI.   She has had no flank pain or fever.  She is recovering from a right hip fracture and ORIF in 3/21.  ROS:  ROS  No Known Allergies  Past Medical History:  Diagnosis Date   Arthritis    Back pain    Cataract    Depression    Diabetes ([place])    x 3 yrs   Hypertension    Thyroid disease     Past Surgical History:  Procedure Laterality Date   ABDOMINAL HYSTERECTOMY     BALLOON DILATION N/A 12/21/2013   Procedure: BALLOON DILATION;  Surgeon: Rogene Houston, MD;  [facility];  Service: Endoscopy;  Laterality: N/A;   BIOPSY  06/28/2019   Procedure: BIOPSY;  Surgeon: Rogene Houston, MD;  [facility];  Service: Endoscopy;;  duodenum    CESAREAN SECTION     COLONOSCOPY N/A 06/28/2019   Procedure: COLONOSCOPY;  Surgeon: Rogene Houston, MD;  [facility];  Service: Endoscopy;  Laterality: N/A;   complete hyster     ESOPHAGEAL DILATION N/A  07/26/2018   Procedure: ESOPHAGEAL DILATION;  Surgeon: Rogene Houston, MD;  [facility];  Service: Endoscopy;  Laterality: N/A;   ESOPHAGOGASTRODUODENOSCOPY N/A 12/21/2013   Procedure: ESOPHAGOGASTRODUODENOSCOPY (EGD);  Surgeon: Rogene Houston, MD;  [facility];  Service: Endoscopy;  Laterality: N/A;  730   ESOPHAGOGASTRODUODENOSCOPY N/A 07/26/2018   Procedure: ESOPHAGOGASTRODUODENOSCOPY (EGD);  Surgeon: Rogene Houston, MD;  [facility];  Service: Endoscopy;  Laterality: N/A;  7:30   ESOPHAGOGASTRODUODENOSCOPY N/A 06/28/2019   Procedure: ESOPHAGOGASTRODUODENOSCOPY (EGD);  Surgeon: Rogene Houston, MD;  [facility];  Service: Endoscopy;  Laterality: N/A;  200pm   ESOPHAGOGASTRODUODENOSCOPY (EGD) WITH ESOPHAGEAL DILATION N/A 08/01/2013   Procedure: ESOPHAGOGASTRODUODENOSCOPY (EGD) WITH ESOPHAGEAL DILATION;  Surgeon: Rogene Houston, MD;  [facility];  Service: Endoscopy;  Laterality: N/A;  345-moved to 100 [place] notified pt   EYE SURGERY     MALONEY DILATION N/A 12/21/2013   Procedure: MALONEY DILATION;  Surgeon: Rogene Houston, MD;  [facility];  Service: Endoscopy;  Laterality: N/A;   ORIF HIP FRACTURE Right 11/18/2019   POLYPECTOMY  06/28/2019   Procedure: POLYPECTOMY;  Surgeon: Rogene Houston,  MD;  [facility];  Service: Endoscopy;;  rectosigmoid   SAVORY DILATION N/A 12/21/2013   Procedure: SAVORY DILATION;  Surgeon: Rogene Houston, MD;  [facility];  Service: Endoscopy;  Laterality: N/A;   SHOULDER SURGERY     THYROID SURGERY      Social History   Socioeconomic History   Marital status: Married    Spouse name: Not on file   Number of children: Not on file   Years of education: Not on file   Highest education level: Not on file  Occupational History   Occupation: retired  Tobacco Use   Smoking status: Never Smoker   Smokeless tobacco: Never Used  Scientific laboratory technician  Use: Never used  Substance and Sexual Activity   Alcohol use: No   Drug use: No   Sexual activity: Not Currently  Other Topics Concern   Not on file  Social History Narrative   Not on file   Social Determinants of Health   Financial Resource Strain:    Difficulty of Paying Living Expenses: Not on file  Food Insecurity:    Worried About Charity fundraiser in the Last Year: Not on file   [place] in the Last Year: Not on file  Transportation Needs:    Lack of Transportation (Medical): Not on file   Lack of Transportation (Non-Medical): Not on file  Physical Activity:    Days of Exercise per Week: Not on file   Minutes of Exercise per Session: Not on file  Stress:    Feeling of Stress : Not on file  Social Connections:    Frequency of Communication with Friends and Family: Not on file   Frequency of Social Gatherings with Friends and Family: Not on file   Attends Religious Services: Not on file   Active Member of Clubs or Organizations: Not on file   Attends Archivist Meetings: Not on file   Marital Status: Not on file  Intimate Partner Violence:    Fear of Current or Ex-Partner: Not on file   Emotionally Abused: Not on file   Physically Abused: Not on file   Sexually Abused: Not on file    Family History  Problem Relation Age of Onset   Cancer Mother    Diabetes Mother    Hyperlipidemia Mother    Cancer Father    Colon cancer Father     Anti-infectives: Anti-infectives (From admission, onward)   None      Current Outpatient Medications  Medication Sig Dispense Refill   alendronate (FOSAMAX) 70 MG tablet Take 70 mg by mouth every morning.     ALPRAZolam (XANAX) 0.5 MG tablet Take 0.5 mg by mouth 4 (four) times daily as needed.     aspirin EC 81 MG tablet Take 1 tablet (81 mg total) by mouth daily.     atorvastatin (LIPITOR) 10 MG tablet Take 10 mg by mouth daily.     chlordiazePOXIDE-amitriptyline (LIMBITROL  DS) 10-25 MG TABS tablet Take by mouth.     docusate sodium (COLACE) 100 MG capsule Take 100 mg by mouth 2 (two) times daily.     glimepiride (AMARYL) 4 MG tablet Take by mouth.     HYDROcodone-acetaminophen (NORCO/VICODIN) 5-325 MG tablet Take 1 tablet by mouth every 6 (six) hours as needed.     lisinopril-hydrochlorothiazide (PRINZIDE,ZESTORETIC) 10-12.5 MG per tablet Take 1 tablet by mouth daily.       Multiple Vitamin (MULTIVITAMIN  WITH MINERALS) TABS tablet Take 1 tablet by mouth daily. ALIVE MULTIVITAMIN     Multiple Vitamins-Minerals (QC DAILY MULTIVIT/MULTIMINERAL) TABS Take by mouth.     omeprazole (PRILOSEC) 40 MG capsule Take by mouth.     ondansetron (ZOFRAN) 4 MG tablet Take 4 mg by mouth 3 (three) times daily as needed.     oxyCODONE-acetaminophen (PERCOCET) 10-325 MG tablet Take by mouth.     pioglitazone (ACTOS) 30 MG tablet Take by mouth.     Semaglutide,0.25 or 0.5MG /DOS, (OZEMPIC, 0.25 OR 0.5 MG/DOSE,) 2 MG/1.5ML SOPN INJECT 0.5 MG ONCE WEEKLY AS DIRECTED     tiZANidine (ZANAFLEX) 4 MG tablet TAKE 1 TABLET BY MOUTH EVERY 8 HOURS AS NEEDED (DO NOT EXCEED THREE DOSES IN 24 HOURS)     estradiol (ESTRACE) 0.1 MG/GM vaginal cream Use 0.5gm or a pea sized amount vaginally at bedtime for 2 weeks and then 2-3x weekly. 42.5 g 12   famotidine (PEPCID) 20 MG tablet TAKE ONE CAPSULE BY MOUTH TWICE DAILY (Patient not taking: Reported on 05/09/2020) 60 tablet 5   LINZESS 145 MCG CAPS capsule Take 145 mcg by mouth daily. (Patient not taking: Reported on 05/09/2020)     metoCLOPramide (REGLAN) 5 MG tablet Take 1 tablet (5 mg total) by mouth 2 (two) times daily before a meal. (Patient not taking: Reported on 05/09/2020) 60 tablet 3   mirabegron ER (MYRBETRIQ) 25 MG TB24 tablet Take 1 tablet (25 mg total) by mouth daily. 28 tablet    ondansetron (ZOFRAN-ODT) 4 MG disintegrating tablet TAKE 1 TABLET BY MOUTH EVERY 8 HOURS AS NEEDED FOR NAUSEA AND VOMITING (Patient not taking:  Reported on 05/09/2020) 30 tablet 1   No current facility-administered medications for this visit.     Objective: Vital signs in last 24 hours: BP 112/66    Pulse 99    Temp (!) 97.4 F (36.3 C)    Ht 5\' 3"  (1.6 m)    Wt 158 lb (71.7 kg)    BMI 27.99 kg/m   Intake/Output from previous day: No intake/output data recorded. Intake/Output this shift: @IOTHISSHIFT @   Physical Exam Vitals reviewed.  Constitutional:      Appearance: Normal appearance.  Abdominal:     General: Abdomen is flat.     Palpations: Abdomen is soft. There is no mass.     Hernia: No hernia is present.  Genitourinary:    Comments: Nl external genitalia. Mild introital stenosis with severe mucosal atrophy. Meatus is normal. Urethra has mild hypermobility.  No leakage with coughing and straining with only 40ml in bladder.  Grade 1 cystocele and rectocele.  Neurological:     General: No focal deficit present.     Mental Status: She is alert and oriented to person, place, and time.  Psychiatric:        Mood and Affect: Mood normal.        Behavior: Behavior normal.     Lab Results:  Results for orders placed or performed in visit on 05/09/20 (from the past 24 hour(s))  Urinalysis, Routine w reflex microscopic     Status: Abnormal   Collection Time: 05/09/20 10:13 AM  Result Value Ref Range   Specific Gravity, UA 1.020 1.005 - 1.030   pH, UA 5.0 5.0 - 7.5   Color, UA Yellow Yellow   Appearance Ur Clear Clear   Leukocytes,UA 1+ (A) Negative   Protein,UA Negative Negative/Trace   Glucose, UA Negative Negative   Ketones, UA Negative Negative   RBC,  UA Negative Negative   Bilirubin, UA Negative Negative   Urobilinogen, Ur 0.2 0.2 - 1.0 mg/dL   Nitrite, UA Negative Negative   Microscopic Examination See below:    Narrative   Performed at:  [facility] [address], [place], [place]  [id number] Lab Director: Mina Marble MT, Phone:  [phone]  Microscopic Examination     Status:  Abnormal   Collection Time: 05/09/20 10:13 AM   Urine  Result Value Ref Range   WBC, UA 11-30 (A) 0 - 5 /hpf   RBC 0-2 0 - 2 /hpf   Epithelial Cells (non renal) 0-10 0 - 10 /hpf   Renal Epithel, UA 0-10 (A) None seen /hpf   Bacteria, UA Few (A) None seen/Few   Narrative   Performed at:  [facility] [address] [place], [place]  [id number] Lab Director: [place], Phone:  [phone]    BMET No results for input(s): NA, K, CL, CO2, GLUCOSE, BUN, CREATININE, CALCIUM in the last 72 hours. PT/INR No results for input(s): LABPROT, INR in the last 72 hours. ABG No results for input(s): PHART, HCO3 in the last 72 hours.  Invalid input(s): PCO2, PO2  Studies/Results: No results found.   Assessment/Plan: 1. MUI.   I am going to try her on Myrbetriq and have given her 25mg  and 50mg  samples.  I discussed the side effects and possible drug interactions.   She will return in 4-6 weeks.  If she doesn't improve, I will consider urodynamics or PTNS.   2.  Recurrent UTI's.  I will culture her urine today.   3.  Atrophic vaginitis.  I have sent estradiol cream and reviewed the instructions and side effects.   Meds ordered this encounter  Medications   mirabegron ER (MYRBETRIQ) 25 MG TB24 tablet    Sig: Take 1 tablet (25 mg total) by mouth daily.    Dispense:  28 tablet    28 25mg  samples and 14 50mg  samples given.   estradiol (ESTRACE) 0.1 MG/GM vaginal cream    Sig: Use 0.5gm or a pea sized amount vaginally at bedtime for 2 weeks and then 2-3x weekly.    Dispense:  42.5 g    Refill:  12     Orders Placed This Encounter  Procedures   Urine Culture   Microscopic Examination   Urinalysis, Routine w reflex microscopic   Bladder scan     Return for 4-6 week f/u.    CC:  Dr. Jerene Bears.      Irine Seal 05/09/2020 [phone]

## 2020-05-09 ENCOUNTER — Ambulatory Visit (INDEPENDENT_AMBULATORY_CARE_PROVIDER_SITE_OTHER): Payer: PPO | Admitting: Urology

## 2020-05-09 ENCOUNTER — Other Ambulatory Visit: Payer: Self-pay

## 2020-05-09 ENCOUNTER — Encounter: Payer: Self-pay | Admitting: Urology

## 2020-05-09 VITALS — BP 112/66 | HR 99 | Temp 97.4°F | Ht 63.0 in | Wt 158.0 lb

## 2020-05-09 DIAGNOSIS — L820 Inflamed seborrheic keratosis: Secondary | ICD-10-CM | POA: Diagnosis not present

## 2020-05-09 DIAGNOSIS — N3946 Mixed incontinence: Secondary | ICD-10-CM | POA: Diagnosis not present

## 2020-05-09 DIAGNOSIS — N952 Postmenopausal atrophic vaginitis: Secondary | ICD-10-CM | POA: Diagnosis not present

## 2020-05-09 DIAGNOSIS — L814 Other melanin hyperpigmentation: Secondary | ICD-10-CM | POA: Diagnosis not present

## 2020-05-09 DIAGNOSIS — L570 Actinic keratosis: Secondary | ICD-10-CM | POA: Diagnosis not present

## 2020-05-09 DIAGNOSIS — N390 Urinary tract infection, site not specified: Secondary | ICD-10-CM | POA: Diagnosis not present

## 2020-05-09 LAB — URINALYSIS, ROUTINE W REFLEX MICROSCOPIC
Bilirubin, UA: NEGATIVE
Glucose, UA: NEGATIVE
Ketones, UA: NEGATIVE
Nitrite, UA: NEGATIVE
Protein,UA: NEGATIVE
RBC, UA: NEGATIVE
Specific Gravity, UA: 1.02 (ref 1.005–1.030)
Urobilinogen, Ur: 0.2 mg/dL (ref 0.2–1.0)
pH, UA: 5 (ref 5.0–7.5)

## 2020-05-09 LAB — MICROSCOPIC EXAMINATION

## 2020-05-09 LAB — BLADDER SCAN: Scan Result: 13

## 2020-05-09 MED ORDER — MIRABEGRON ER 25 MG PO TB24: 25.0000 mg | ORAL_TABLET | Freq: Every day | ORAL | Status: DC

## 2020-05-09 MED ORDER — ESTRADIOL 0.1 MG/GM VA CREA: TOPICAL_CREAM | VAGINAL | 12 refills | Status: AC

## 2020-05-09 NOTE — Progress Notes (Signed)
Urological Symptom Review  Patient is experiencing the following symptoms: Frequent urination Hard to postpone urination Burning/pain with urination Get up at night to urinate Leakage of urine Stream starts and stops Trouble starting stream Urinary tract infection Weak stream   Review of Systems  Gastrointestinal (upper)  : Negative for upper GI symptoms  Gastrointestinal (lower) : Negative for lower GI symptoms  Constitutional : Negative for symptoms  Skin: Negative for skin symptoms  Eyes: Negative for eye symptoms  Ear/Nose/Throat : Negative for Ear/Nose/Throat symptoms  Hematologic/Lymphatic: Negative for Hematologic/Lymphatic symptoms  Cardiovascular : Negative for cardiovascular symptoms  Respiratory : Negative for respiratory symptoms  Endocrine: Negative for endocrine symptoms  Musculoskeletal: Negative for musculoskeletal symptoms  Neurological: Negative for neurological symptoms  Psychologic: Negative for psychiatric symptoms

## 2020-05-11 LAB — URINE CULTURE

## 2020-05-12 ENCOUNTER — Telehealth: Payer: Self-pay

## 2020-05-12 NOTE — Telephone Encounter (Signed)
-----   Message from Irine Seal, MD sent at 05/12/2020  9:21 AM EDT ----- Her culture and UA micro are negative.  No meds needed.

## 2020-05-12 NOTE — Telephone Encounter (Signed)
Pt called and made aware

## 2020-05-15 DIAGNOSIS — M5416 Radiculopathy, lumbar region: Secondary | ICD-10-CM | POA: Diagnosis not present

## 2020-05-15 DIAGNOSIS — F1120 Opioid dependence, uncomplicated: Secondary | ICD-10-CM | POA: Diagnosis not present

## 2020-05-15 DIAGNOSIS — M48061 Spinal stenosis, lumbar region without neurogenic claudication: Secondary | ICD-10-CM | POA: Diagnosis not present

## 2020-05-20 DIAGNOSIS — Z7983 Long term (current) use of bisphosphonates: Secondary | ICD-10-CM | POA: Diagnosis not present

## 2020-05-20 DIAGNOSIS — F4321 Adjustment disorder with depressed mood: Secondary | ICD-10-CM | POA: Diagnosis not present

## 2020-05-20 DIAGNOSIS — S72001D Fracture of unspecified part of neck of right femur, subsequent encounter for closed fracture with routine healing: Secondary | ICD-10-CM | POA: Diagnosis not present

## 2020-05-20 DIAGNOSIS — E1140 Type 2 diabetes mellitus with diabetic neuropathy, unspecified: Secondary | ICD-10-CM | POA: Diagnosis not present

## 2020-05-20 DIAGNOSIS — Z9181 History of falling: Secondary | ICD-10-CM | POA: Diagnosis not present

## 2020-05-20 DIAGNOSIS — R54 Age-related physical debility: Secondary | ICD-10-CM | POA: Diagnosis not present

## 2020-05-22 DIAGNOSIS — E1165 Type 2 diabetes mellitus with hyperglycemia: Secondary | ICD-10-CM | POA: Diagnosis not present

## 2020-05-22 DIAGNOSIS — I10 Essential (primary) hypertension: Secondary | ICD-10-CM | POA: Diagnosis not present

## 2020-05-28 DIAGNOSIS — Z299 Encounter for prophylactic measures, unspecified: Secondary | ICD-10-CM | POA: Diagnosis not present

## 2020-05-28 DIAGNOSIS — I10 Essential (primary) hypertension: Secondary | ICD-10-CM | POA: Diagnosis not present

## 2020-05-28 DIAGNOSIS — J069 Acute upper respiratory infection, unspecified: Secondary | ICD-10-CM | POA: Diagnosis not present

## 2020-05-28 DIAGNOSIS — R1310 Dysphagia, unspecified: Secondary | ICD-10-CM | POA: Diagnosis not present

## 2020-06-12 DIAGNOSIS — M47816 Spondylosis without myelopathy or radiculopathy, lumbar region: Secondary | ICD-10-CM | POA: Diagnosis not present

## 2020-06-12 DIAGNOSIS — M48061 Spinal stenosis, lumbar region without neurogenic claudication: Secondary | ICD-10-CM | POA: Diagnosis not present

## 2020-06-20 DIAGNOSIS — E1165 Type 2 diabetes mellitus with hyperglycemia: Secondary | ICD-10-CM | POA: Diagnosis not present

## 2020-06-20 DIAGNOSIS — I10 Essential (primary) hypertension: Secondary | ICD-10-CM | POA: Diagnosis not present

## 2020-06-20 DIAGNOSIS — D485 Neoplasm of uncertain behavior of skin: Secondary | ICD-10-CM | POA: Diagnosis not present

## 2020-06-20 DIAGNOSIS — L821 Other seborrheic keratosis: Secondary | ICD-10-CM | POA: Diagnosis not present

## 2020-06-20 DIAGNOSIS — L570 Actinic keratosis: Secondary | ICD-10-CM | POA: Diagnosis not present

## 2020-06-21 DIAGNOSIS — E1165 Type 2 diabetes mellitus with hyperglycemia: Secondary | ICD-10-CM | POA: Diagnosis not present

## 2020-06-27 ENCOUNTER — Ambulatory Visit: Payer: PPO | Admitting: Urology

## 2020-06-27 DIAGNOSIS — N3946 Mixed incontinence: Secondary | ICD-10-CM

## 2020-07-09 DIAGNOSIS — C44329 Squamous cell carcinoma of skin of other parts of face: Secondary | ICD-10-CM | POA: Diagnosis not present

## 2020-07-10 ENCOUNTER — Other Ambulatory Visit (INDEPENDENT_AMBULATORY_CARE_PROVIDER_SITE_OTHER): Payer: Self-pay | Admitting: Gastroenterology

## 2020-07-10 DIAGNOSIS — M5416 Radiculopathy, lumbar region: Secondary | ICD-10-CM | POA: Diagnosis not present

## 2020-07-10 DIAGNOSIS — M47816 Spondylosis without myelopathy or radiculopathy, lumbar region: Secondary | ICD-10-CM | POA: Diagnosis not present

## 2020-07-10 DIAGNOSIS — M48061 Spinal stenosis, lumbar region without neurogenic claudication: Secondary | ICD-10-CM | POA: Diagnosis not present

## 2020-07-22 DIAGNOSIS — E1165 Type 2 diabetes mellitus with hyperglycemia: Secondary | ICD-10-CM | POA: Diagnosis not present

## 2020-07-22 DIAGNOSIS — I10 Essential (primary) hypertension: Secondary | ICD-10-CM | POA: Diagnosis not present

## 2020-07-31 DIAGNOSIS — I739 Peripheral vascular disease, unspecified: Secondary | ICD-10-CM | POA: Diagnosis not present

## 2020-07-31 DIAGNOSIS — E1165 Type 2 diabetes mellitus with hyperglycemia: Secondary | ICD-10-CM | POA: Diagnosis not present

## 2020-07-31 DIAGNOSIS — F419 Anxiety disorder, unspecified: Secondary | ICD-10-CM | POA: Diagnosis not present

## 2020-07-31 DIAGNOSIS — I10 Essential (primary) hypertension: Secondary | ICD-10-CM | POA: Diagnosis not present

## 2020-07-31 DIAGNOSIS — F322 Major depressive disorder, single episode, severe without psychotic features: Secondary | ICD-10-CM | POA: Diagnosis not present

## 2020-07-31 DIAGNOSIS — Z299 Encounter for prophylactic measures, unspecified: Secondary | ICD-10-CM | POA: Diagnosis not present

## 2020-08-13 DIAGNOSIS — Z299 Encounter for prophylactic measures, unspecified: Secondary | ICD-10-CM | POA: Diagnosis not present

## 2020-08-13 DIAGNOSIS — R350 Frequency of micturition: Secondary | ICD-10-CM | POA: Diagnosis not present

## 2020-08-13 DIAGNOSIS — N390 Urinary tract infection, site not specified: Secondary | ICD-10-CM | POA: Diagnosis not present

## 2020-08-21 DIAGNOSIS — I10 Essential (primary) hypertension: Secondary | ICD-10-CM | POA: Diagnosis not present

## 2020-08-21 DIAGNOSIS — E1165 Type 2 diabetes mellitus with hyperglycemia: Secondary | ICD-10-CM | POA: Diagnosis not present

## 2020-08-27 NOTE — Progress Notes (Deleted)
Subjective: 1. Mixed incontinence urge and stress     05/09/20: Wendy Fuller returns in f/u.  She was seen in July and was found to have citrobacter that was treated.   She didn't have improvement in her incontinence with antibiotic therapy.  She lost her husband about 3 weeks ago after a battle with COVID.  She is using 1ppd.  It is variably wet.  She has MUI.   She feels she is emptying her bladder.  She has some vaginal irritation.    03/07/20: Wendy Fuller returns today in f/u and was last seen in 2016.   She is back with UTI symptoms today.  She had hematuria yesterday but that has cleared and the urine is dark today.  She had strep on a culture in 10/20.   She has a history of atrophic vaginitis with recurrent UTI's and incomplete emptying. She is no longer on estrace cream or tamsulosin.   She had been on nitrofurantion for suppression but not in some time. She has some  dysuria. She has daytime frequency but only nocturia x 1. She has urgency with UUI and  SUI.   She has had no flank pain or fever.  She is recovering from a right hip fracture and ORIF in 3/21.  ROS:  ROS  No Known Allergies  Past Medical History:  Diagnosis Date  . Arthritis   . Back pain   . Cataract   . Depression   . Diabetes ([facility])    x 3 yrs  . Hypertension   . Thyroid disease     Past Surgical History:  Procedure Laterality Date  . ABDOMINAL HYSTERECTOMY    . BALLOON DILATION N/A 12/21/2013   Procedure: BALLOON DILATION;  Surgeon: Malissa Hippo, MD;  [facility];  Service: Endoscopy;  Laterality: N/A;  . BIOPSY  06/28/2019   Procedure: BIOPSY;  Surgeon: Malissa Hippo, MD;  [facility];  Service: Endoscopy;;  duodenum   . CESAREAN SECTION    . COLONOSCOPY N/A 06/28/2019   Procedure: COLONOSCOPY;  Surgeon: Malissa Hippo, MD;  [facility];  Service: Endoscopy;  Laterality: N/A;  . complete hyster    . ESOPHAGEAL DILATION N/A 07/26/2018   Procedure: ESOPHAGEAL DILATION;   Surgeon: Malissa Hippo, MD;  [facility];  Service: Endoscopy;  Laterality: N/A;  . ESOPHAGOGASTRODUODENOSCOPY N/A 12/21/2013   Procedure: ESOPHAGOGASTRODUODENOSCOPY (EGD);  Surgeon: Malissa Hippo, MD;  [facility];  Service: Endoscopy;  Laterality: N/A;  730  . ESOPHAGOGASTRODUODENOSCOPY N/A 07/26/2018   Procedure: ESOPHAGOGASTRODUODENOSCOPY (EGD);  Surgeon: Malissa Hippo, MD;  [facility];  Service: Endoscopy;  Laterality: N/A;  7:30  . ESOPHAGOGASTRODUODENOSCOPY N/A 06/28/2019   Procedure: ESOPHAGOGASTRODUODENOSCOPY (EGD);  Surgeon: Malissa Hippo, MD;  [facility];  Service: Endoscopy;  Laterality: N/A;  200pm  . ESOPHAGOGASTRODUODENOSCOPY (EGD) WITH ESOPHAGEAL DILATION N/A 08/01/2013   Procedure: ESOPHAGOGASTRODUODENOSCOPY (EGD) WITH ESOPHAGEAL DILATION;  Surgeon: Malissa Hippo, MD;  [facility];  Service: Endoscopy;  Laterality: N/A;  345-moved to 100 [place] notified pt  . EYE SURGERY    . MALONEY DILATION N/A 12/21/2013   Procedure: Elease Hashimoto DILATION;  Surgeon: Malissa Hippo, MD;  [facility];  Service: Endoscopy;  Laterality: N/A;  . ORIF HIP FRACTURE Right 11/18/2019  . POLYPECTOMY  06/28/2019   Procedure: POLYPECTOMY;  Surgeon: Malissa Hippo, MD;  [facility];  Service: Endoscopy;;  rectosigmoid  . SAVORY DILATION N/A 12/21/2013   Procedure: SAVORY DILATION;  Surgeon: Rogene Houston, MD;  [facility];  Service: Endoscopy;  Laterality: N/A;  . SHOULDER SURGERY    . THYROID SURGERY      Social History   Socioeconomic History  . Marital status: Married    Spouse name: Not on file  . Number of children: Not on file  . Years of education: Not on file  . Highest education level: Not on file  Occupational History  . Occupation: retired  Tobacco Use  . Smoking status: Never Smoker  . Smokeless tobacco: Never Used  Vaping Use  . Vaping Use: Never used  Substance and Sexual  Activity  . Alcohol use: No  . Drug use: No  . Sexual activity: Not Currently  Other Topics Concern  . Not on file  Social History Narrative  . Not on file   Social Determinants of Health   Financial Resource Strain: Not on file  Food Insecurity: Not on file  Transportation Needs: Not on file  Physical Activity: Not on file  Stress: Not on file  Social Connections: Not on file  Intimate Partner Violence: Not on file    Family History  Problem Relation Age of Onset  . Cancer Mother   . Diabetes Mother   . Hyperlipidemia Mother   . Cancer Father   . Colon cancer Father     Anti-infectives: Anti-infectives (From admission, onward)   None      Current Outpatient Medications  Medication Sig Dispense Refill  . alendronate (FOSAMAX) 70 MG tablet Take 70 mg by mouth every morning.    Marland Kitchen ALPRAZolam (XANAX) 0.5 MG tablet Take 0.5 mg by mouth 4 (four) times daily as needed.    Marland Kitchen aspirin EC 81 MG tablet Take 1 tablet (81 mg total) by mouth daily.    Marland Kitchen atorvastatin (LIPITOR) 10 MG tablet Take 10 mg by mouth daily.    . chlordiazePOXIDE-amitriptyline (LIMBITROL DS) 10-25 MG TABS tablet Take by mouth.    . docusate sodium (COLACE) 100 MG capsule Take 100 mg by mouth 2 (two) times daily.    Marland Kitchen estradiol (ESTRACE) 0.1 MG/GM vaginal cream Use 0.5gm or a pea sized amount vaginally at bedtime for 2 weeks and then 2-3x weekly. 42.5 g 12  . famotidine (PEPCID) 20 MG tablet TAKE ONE CAPSULE BY MOUTH TWICE DAILY (Patient not taking: Reported on 05/09/2020) 60 tablet 5  . glimepiride (AMARYL) 4 MG tablet Take by mouth.    Marland Kitchen HYDROcodone-acetaminophen (NORCO/VICODIN) 5-325 MG tablet Take 1 tablet by mouth every 6 (six) hours as needed.    Marland Kitchen LINZESS 145 MCG CAPS capsule Take 145 mcg by mouth daily. (Patient not taking: Reported on 05/09/2020)    . lisinopril-hydrochlorothiazide (PRINZIDE,ZESTORETIC) 10-12.5 MG per tablet Take 1 tablet by mouth daily.      . metoCLOPramide (REGLAN) 5 MG tablet Take 1  tablet (5 mg total) by mouth 2 (two) times daily before a meal. (Patient not taking: Reported on 05/09/2020) 60 tablet 3  . mirabegron ER (MYRBETRIQ) 25 MG TB24 tablet Take 1 tablet (25 mg total) by mouth daily. 28 tablet   . Multiple Vitamin (MULTIVITAMIN WITH MINERALS) TABS tablet Take 1 tablet by mouth daily. ALIVE MULTIVITAMIN    . Multiple Vitamins-Minerals (QC DAILY MULTIVIT/MULTIMINERAL) TABS Take by mouth.    Marland Kitchen omeprazole (PRILOSEC) 40 MG capsule Take by mouth.    . ondansetron (ZOFRAN) 4 MG tablet Take 4 mg  by mouth 3 (three) times daily as needed.    . ondansetron (ZOFRAN-ODT) 4 MG disintegrating tablet TAKE 1 TABLET BY MOUTH EVERY 8 HOURS AS NEEDED FOR NAUSEA AND VOMITING (Patient not taking: Reported on 05/09/2020) 30 tablet 1  . oxyCODONE-acetaminophen (PERCOCET) 10-325 MG tablet Take by mouth.    . pioglitazone (ACTOS) 30 MG tablet Take by mouth.    . Semaglutide,0.25 or 0.5MG /DOS, (OZEMPIC, 0.25 OR 0.5 MG/DOSE,) 2 MG/1.5ML SOPN INJECT 0.5 MG ONCE WEEKLY AS DIRECTED    . tiZANidine (ZANAFLEX) 4 MG tablet TAKE 1 TABLET BY MOUTH EVERY 8 HOURS AS NEEDED (DO NOT EXCEED THREE DOSES IN 24 HOURS)     No current facility-administered medications for this visit.     Objective: Vital signs in last 24 hours: There were no vitals taken for this visit.  Intake/Output from previous day: No intake/output data recorded. Intake/Output this shift: @IOTHISSHIFT @   Physical Exam Vitals reviewed.  Constitutional:      Appearance: Normal appearance.  Abdominal:     General: Abdomen is flat.     Palpations: Abdomen is soft. There is no mass.     Hernia: No hernia is present.  Genitourinary:    Comments: Nl external genitalia. Mild introital stenosis with severe mucosal atrophy. Meatus is normal. Urethra has mild hypermobility.  No leakage with coughing and straining with only 34ml in bladder.  Grade 1 cystocele and rectocele.  Neurological:     General: No focal deficit present.      Mental Status: She is alert and oriented to person, place, and time.  Psychiatric:        Mood and Affect: Mood normal.        Behavior: Behavior normal.     Lab Results:  No results found for this or any previous visit (from the past 24 hour(s)).  BMET No results for input(s): NA, K, CL, CO2, GLUCOSE, BUN, CREATININE, CALCIUM in the last 72 hours. PT/INR No results for input(s): LABPROT, INR in the last 72 hours. ABG No results for input(s): PHART, HCO3 in the last 72 hours.  Invalid input(s): PCO2, PO2  Studies/Results: No results found.   Assessment/Plan: 1. MUI.   I am going to try her on Myrbetriq and have given her 25mg  and 50mg  samples.  I discussed the side effects and possible drug interactions.   She will return in 4-6 weeks.  If she doesn't improve, I will consider urodynamics or PTNS.   2.  Recurrent UTI's.  I will culture her urine today.   3.  Atrophic vaginitis.  I have sent estradiol cream and reviewed the instructions and side effects.   No orders of the defined types were placed in this encounter.    No orders of the defined types were placed in this encounter.    No follow-ups on file.    CC:  Dr. Jerene Bears.      Irine Seal 08/27/2020 [phone]

## 2020-08-28 ENCOUNTER — Ambulatory Visit: Payer: PPO | Admitting: Urology

## 2020-08-28 DIAGNOSIS — N3946 Mixed incontinence: Secondary | ICD-10-CM

## 2020-09-29 DIAGNOSIS — K5730 Diverticulosis of large intestine without perforation or abscess without bleeding: Secondary | ICD-10-CM | POA: Diagnosis not present

## 2020-09-29 DIAGNOSIS — R109 Unspecified abdominal pain: Secondary | ICD-10-CM | POA: Diagnosis not present

## 2020-09-29 DIAGNOSIS — I700 Atherosclerosis of aorta: Secondary | ICD-10-CM | POA: Diagnosis not present

## 2020-09-30 DIAGNOSIS — N1832 Chronic kidney disease, stage 3b: Secondary | ICD-10-CM | POA: Diagnosis not present

## 2020-09-30 DIAGNOSIS — I10 Essential (primary) hypertension: Secondary | ICD-10-CM | POA: Diagnosis not present

## 2020-09-30 DIAGNOSIS — J069 Acute upper respiratory infection, unspecified: Secondary | ICD-10-CM | POA: Diagnosis not present

## 2020-09-30 DIAGNOSIS — Z299 Encounter for prophylactic measures, unspecified: Secondary | ICD-10-CM | POA: Diagnosis not present

## 2020-09-30 DIAGNOSIS — R059 Cough, unspecified: Secondary | ICD-10-CM | POA: Diagnosis not present

## 2020-10-16 DIAGNOSIS — M48061 Spinal stenosis, lumbar region without neurogenic claudication: Secondary | ICD-10-CM | POA: Diagnosis not present

## 2020-10-16 DIAGNOSIS — Z961 Presence of intraocular lens: Secondary | ICD-10-CM | POA: Diagnosis not present

## 2020-10-16 DIAGNOSIS — M5416 Radiculopathy, lumbar region: Secondary | ICD-10-CM | POA: Diagnosis not present

## 2020-10-16 DIAGNOSIS — M47816 Spondylosis without myelopathy or radiculopathy, lumbar region: Secondary | ICD-10-CM | POA: Diagnosis not present

## 2020-10-16 DIAGNOSIS — H26491 Other secondary cataract, right eye: Secondary | ICD-10-CM | POA: Diagnosis not present

## 2020-10-20 DIAGNOSIS — I10 Essential (primary) hypertension: Secondary | ICD-10-CM | POA: Diagnosis not present

## 2020-10-20 DIAGNOSIS — F329 Major depressive disorder, single episode, unspecified: Secondary | ICD-10-CM | POA: Diagnosis not present

## 2020-10-20 DIAGNOSIS — E119 Type 2 diabetes mellitus without complications: Secondary | ICD-10-CM | POA: Diagnosis not present

## 2020-10-20 DIAGNOSIS — E7800 Pure hypercholesterolemia, unspecified: Secondary | ICD-10-CM | POA: Diagnosis not present

## 2020-10-20 DIAGNOSIS — E1165 Type 2 diabetes mellitus with hyperglycemia: Secondary | ICD-10-CM | POA: Diagnosis not present

## 2020-11-03 ENCOUNTER — Other Ambulatory Visit: Payer: Self-pay

## 2020-11-03 ENCOUNTER — Other Ambulatory Visit: Payer: Medicare HMO

## 2020-11-03 ENCOUNTER — Telehealth: Payer: Self-pay | Admitting: Urology

## 2020-11-03 DIAGNOSIS — H5213 Myopia, bilateral: Secondary | ICD-10-CM | POA: Diagnosis not present

## 2020-11-03 DIAGNOSIS — Z0100 Encounter for examination of eyes and vision without abnormal findings: Secondary | ICD-10-CM | POA: Diagnosis not present

## 2020-11-03 DIAGNOSIS — N390 Urinary tract infection, site not specified: Secondary | ICD-10-CM

## 2020-11-03 NOTE — Telephone Encounter (Signed)
Pt's daughter called and said her mom is having some burning when she uses the bathroom. She wanted to get her in before Thursday but I let her know Jeffie Pollock isn't here except on Thursdays. She wanted to talk with someone to see what she can do for her.

## 2020-11-04 DIAGNOSIS — Z299 Encounter for prophylactic measures, unspecified: Secondary | ICD-10-CM | POA: Diagnosis not present

## 2020-11-04 DIAGNOSIS — F339 Major depressive disorder, recurrent, unspecified: Secondary | ICD-10-CM | POA: Diagnosis not present

## 2020-11-04 DIAGNOSIS — F419 Anxiety disorder, unspecified: Secondary | ICD-10-CM | POA: Diagnosis not present

## 2020-11-04 DIAGNOSIS — F418 Other specified anxiety disorders: Secondary | ICD-10-CM | POA: Diagnosis not present

## 2020-11-04 DIAGNOSIS — E1165 Type 2 diabetes mellitus with hyperglycemia: Secondary | ICD-10-CM | POA: Diagnosis not present

## 2020-11-04 DIAGNOSIS — I10 Essential (primary) hypertension: Secondary | ICD-10-CM | POA: Diagnosis not present

## 2020-11-04 LAB — URINALYSIS, ROUTINE W REFLEX MICROSCOPIC
Bilirubin, UA: NEGATIVE
Glucose, UA: NEGATIVE
Ketones, UA: NEGATIVE
Leukocytes,UA: NEGATIVE
Nitrite, UA: NEGATIVE
Protein,UA: NEGATIVE
RBC, UA: NEGATIVE
Specific Gravity, UA: 1.015 (ref 1.005–1.030)
Urobilinogen, Ur: 0.2 mg/dL (ref 0.2–1.0)
pH, UA: 6.5 (ref 5.0–7.5)

## 2020-11-04 NOTE — Telephone Encounter (Signed)
Notified pt her urine was negative.

## 2020-11-05 NOTE — Progress Notes (Signed)
Subjective: 1. Mixed incontinence urge and stress   2. Recurrent UTI   3. Vaginal atrophy      11/05/20: Wendy Fuller returns today in f/u.  She had the onset last week of irritative voiding symptoms.  He UA was clear on drop off but she has >100K enterococcus on culture.  She continues to use the premarin cream.  She is otherwise doing better with Myrbetriq 50mg  for the UUI but she still requires pads.  She is out of the Myrbetriq samples.   She has had no hematuria.   UA is clear.   05/09/20: Wendy Fuller returns in f/u.  She was seen in July and was found to have citrobacter that was treated.   She didn't have improvement in her incontinence with antibiotic therapy.  She lost her husband about 3 weeks ago after a battle with COVID.  She is using 1ppd.  It is variably wet.  She has MUI.   She feels she is emptying her bladder.  She has some vaginal irritation.    03/07/20: Wendy Fuller returns today in f/u and was last seen in 2016.   She is back with UTI symptoms today.  She had hematuria yesterday but that has cleared and the urine is dark today.  She had strep on a culture in 10/20.   She has a history of atrophic vaginitis with recurrent UTI's and incomplete emptying. She is no longer on estrace cream or tamsulosin.   She had been on nitrofurantion for suppression but not in some time. She has some  dysuria. She has daytime frequency but only nocturia x 1. She has urgency with UUI and  SUI.   She has had no flank pain or fever.  She is recovering from a right hip fracture and ORIF in 3/21.  ROS:  ROS  Allergies  Allergen Reactions  . Penicillins Hives and Rash    Has patient had a PCN reaction causing immediate rash, facial/tongue/throat swelling, SOB or lightheadedness with hypotension: No Has patient had a PCN reaction causing severe rash involving mucus membranes or skin necrosis: No Has patient had a PCN reaction that required hospitalization: No Has patient had a PCN reaction occurring  within the last 10 years: No If all of the above answers are "NO", then may proceed with Cephalosporin use.     Past Medical History:  Diagnosis Date  . Arthritis   . Back pain   . Cataract   . Depression   . Diabetes ([place])    x 3 yrs  . Hypertension   . Thyroid disease     Past Surgical History:  Procedure Laterality Date  . ABDOMINAL HYSTERECTOMY    . BALLOON DILATION N/A 12/21/2013   Procedure: BALLOON DILATION;  Surgeon: Rogene Houston, MD;  [facility];  Service: Endoscopy;  Laterality: N/A;  . BIOPSY  06/28/2019   Procedure: BIOPSY;  Surgeon: Rogene Houston, MD;  [facility];  Service: Endoscopy;;  duodenum   . CESAREAN SECTION    . COLONOSCOPY N/A 06/28/2019   Procedure: COLONOSCOPY;  Surgeon: Rogene Houston, MD;  [facility];  Service: Endoscopy;  Laterality: N/A;  . complete hyster    . ESOPHAGEAL DILATION N/A 07/26/2018   Procedure: ESOPHAGEAL DILATION;  Surgeon: Rogene Houston, MD;  [facility];  Service: Endoscopy;  Laterality: N/A;  . ESOPHAGOGASTRODUODENOSCOPY N/A 12/21/2013   Procedure: ESOPHAGOGASTRODUODENOSCOPY (EGD);  Surgeon: Rogene Houston, MD;  [facility];  Service: Endoscopy;  Laterality: N/A;  730  . ESOPHAGOGASTRODUODENOSCOPY N/A 07/26/2018   Procedure: ESOPHAGOGASTRODUODENOSCOPY (EGD);  Surgeon: Rogene Houston, MD;  [facility];  Service: Endoscopy;  Laterality: N/A;  7:30  . ESOPHAGOGASTRODUODENOSCOPY N/A 06/28/2019   Procedure: ESOPHAGOGASTRODUODENOSCOPY (EGD);  Surgeon: Rogene Houston, MD;  [facility];  Service: Endoscopy;  Laterality: N/A;  200pm  . ESOPHAGOGASTRODUODENOSCOPY (EGD) WITH ESOPHAGEAL DILATION N/A 08/01/2013   Procedure: ESOPHAGOGASTRODUODENOSCOPY (EGD) WITH ESOPHAGEAL DILATION;  Surgeon: Rogene Houston, MD;  [facility];  Service: Endoscopy;  Laterality: N/A;  345-moved to 100 [place] notified pt  . EYE SURGERY    . MALONEY DILATION N/A  12/21/2013   Procedure: Venia Minks DILATION;  Surgeon: Rogene Houston, MD;  [facility];  Service: Endoscopy;  Laterality: N/A;  . ORIF HIP FRACTURE Right 11/18/2019  . POLYPECTOMY  06/28/2019   Procedure: POLYPECTOMY;  Surgeon: Rogene Houston, MD;  [facility];  Service: Endoscopy;;  rectosigmoid  . SAVORY DILATION N/A 12/21/2013   Procedure: SAVORY DILATION;  Surgeon: Rogene Houston, MD;  [facility];  Service: Endoscopy;  Laterality: N/A;  . SHOULDER SURGERY    . THYROID SURGERY      Social History   Socioeconomic History  . Marital status: Married    Spouse name: Not on file  . Number of children: Not on file  . Years of education: Not on file  . Highest education level: Not on file  Occupational History  . Occupation: retired  Tobacco Use  . Smoking status: Never Smoker  . Smokeless tobacco: Never Used  Vaping Use  . Vaping Use: Never used  Substance and Sexual Activity  . Alcohol use: No  . Drug use: No  . Sexual activity: Not Currently  Other Topics Concern  . Not on file  Social History Narrative  . Not on file   Social Determinants of Health   Financial Resource Strain: Not on file  Food Insecurity: Not on file  Transportation Needs: Not on file  Physical Activity: Not on file  Stress: Not on file  Social Connections: Not on file  Intimate Partner Violence: Not on file    Family History  Problem Relation Age of Onset  . Cancer Mother   . Diabetes Mother   . Hyperlipidemia Mother   . Cancer Father   . Colon cancer Father     Anti-infectives: Anti-infectives (From admission, onward)   Start     Dose/Rate Route Frequency Ordered Stop   11/06/20 0000  nitrofurantoin, macrocrystal-monohydrate, (MACROBID) 100 MG capsule        100 mg Oral Every 12 hours 11/06/20 1522        Current Outpatient Medications  Medication Sig Dispense Refill  . alendronate (FOSAMAX) 70 MG tablet Take 70 mg by mouth every morning.    Marland Kitchen  ALPRAZolam (XANAX) 0.5 MG tablet Take 0.5 mg by mouth 4 (four) times daily as needed.    Marland Kitchen aspirin EC 81 MG tablet Take 1 tablet (81 mg total) by mouth daily.    Marland Kitchen atorvastatin (LIPITOR) 10 MG tablet Take 10 mg by mouth daily.    . chlordiazePOXIDE-amitriptyline (LIMBITROL DS) 10-25 MG TABS tablet Take by mouth.    . docusate sodium (COLACE) 100 MG capsule Take 100 mg by mouth 2 (two) times daily.    Marland Kitchen estradiol (ESTRACE) 0.1 MG/GM vaginal cream Use 0.5gm or a pea sized amount vaginally at  bedtime for 2 weeks and then 2-3x weekly. 42.5 g 12  . famotidine (PEPCID) 20 MG tablet TAKE ONE CAPSULE BY MOUTH TWICE DAILY 60 tablet 5  . glimepiride (AMARYL) 4 MG tablet Take by mouth.    Marland Kitchen HYDROcodone-acetaminophen (NORCO/VICODIN) 5-325 MG tablet Take 1 tablet by mouth every 6 (six) hours as needed.    Marland Kitchen LINZESS 145 MCG CAPS capsule Take 145 mcg by mouth daily.    Marland Kitchen lisinopril-hydrochlorothiazide (PRINZIDE,ZESTORETIC) 10-12.5 MG per tablet Take 1 tablet by mouth daily.    . metoCLOPramide (REGLAN) 5 MG tablet Take 1 tablet (5 mg total) by mouth 2 (two) times daily before a meal. 60 tablet 3  . mirabegron ER (MYRBETRIQ) 50 MG TB24 tablet Take 1 tablet (50 mg total) by mouth daily. 30 tablet 11  . Multiple Vitamin (MULTIVITAMIN WITH MINERALS) TABS tablet Take 1 tablet by mouth daily. ALIVE MULTIVITAMIN    . Multiple Vitamins-Minerals (QC DAILY MULTIVIT/MULTIMINERAL) TABS Take by mouth.    . nitrofurantoin, macrocrystal-monohydrate, (MACROBID) 100 MG capsule Take 1 capsule (100 mg total) by mouth every 12 (twelve) hours. 14 capsule 0  . omeprazole (PRILOSEC) 40 MG capsule Take by mouth.    . ondansetron (ZOFRAN) 4 MG tablet Take 4 mg by mouth 3 (three) times daily as needed.    . ondansetron (ZOFRAN-ODT) 4 MG disintegrating tablet TAKE 1 TABLET BY MOUTH EVERY 8 HOURS AS NEEDED FOR NAUSEA AND VOMITING 30 tablet 1  . oxyCODONE-acetaminophen (PERCOCET) 10-325 MG tablet Take by mouth.    . pioglitazone (ACTOS) 30  MG tablet Take by mouth.    . Semaglutide,0.25 or 0.5MG /DOS, (OZEMPIC, 0.25 OR 0.5 MG/DOSE,) 2 MG/1.5ML SOPN INJECT 0.5 MG ONCE WEEKLY AS DIRECTED    . tiZANidine (ZANAFLEX) 4 MG tablet TAKE 1 TABLET BY MOUTH EVERY 8 HOURS AS NEEDED (DO NOT EXCEED THREE DOSES IN 24 HOURS)     No current facility-administered medications for this visit.     Objective: Vital signs in last 24 hours: BP 124/69   Pulse (!) 106   Temp 98.4 F (36.9 C)   Ht 5\' 3"  (1.6 m)   Wt 158 lb (71.7 kg)   BMI 27.99 kg/m   Intake/Output from previous day: No intake/output data recorded. Intake/Output this shift: @IOTHISSHIFT @   Physical Exam  Lab Results:  No results found for this or any previous visit (from the past 24 hour(s)).  Results for orders placed or performed in visit on 11/03/20 (from the past 72 hour(s))  Urinalysis, Routine w reflex microscopic     Status: None   Collection Time: 11/03/20  4:08 PM  Result Value Ref Range   Specific Gravity, UA 1.015 1.005 - 1.030   pH, UA 6.5 5.0 - 7.5   Color, UA Yellow Yellow   Appearance Ur Clear Clear   Leukocytes,UA Negative Negative   Protein,UA Negative Negative/Trace   Glucose, UA Negative Negative   Ketones, UA Negative Negative   RBC, UA Negative Negative   Bilirubin, UA Negative Negative   Urobilinogen, Ur 0.2 0.2 - 1.0 mg/dL   Nitrite, UA Negative Negative   Microscopic Examination Comment     Comment: Microscopic follows if indicated.  CULTURE, URINE COMPREHENSIVE     Status: Abnormal   Collection Time: 11/03/20  4:46 PM   Specimen: Urine   Urine  Result Value Ref Range   Urine Culture, Comprehensive Final report (A)    Organism ID, Bacteria Enterococcus faecalis (A)     Comment: Greater than 100,000  colony forming units per mL   ANTIMICROBIAL SUSCEPTIBILITY Comment     Comment:       ** S = Susceptible; I = Intermediate; R = Resistant **                    P = Positive; N = Negative             MICS are expressed in micrograms per  mL    Antibiotic                 RSLT#1    RSLT#2    RSLT#3    RSLT#4 Ciprofloxacin                  R Levofloxacin                   R Nitrofurantoin                 S Penicillin                     S Tetracycline                   R Vancomycin                     S      Studies/Results: No results found.   Assessment/Plan: 1. MUI.  I have given her additional Myrbetriq 50mg  samples and sent a script.      2.  Recurrent UTI's.  Enterococcus on culture with a clear urine.  I will treat with macrobid since she is symptomatic.   3.  Atrophic vaginitis.  Continue estradiol cream.   Meds ordered this encounter  Medications  . nitrofurantoin, macrocrystal-monohydrate, (MACROBID) 100 MG capsule    Sig: Take 1 capsule (100 mg total) by mouth every 12 (twelve) hours.    Dispense:  14 capsule    Refill:  0  . mirabegron ER (MYRBETRIQ) 50 MG TB24 tablet    Sig: Take 1 tablet (50 mg total) by mouth daily.    Dispense:  30 tablet    Refill:  11     Orders Placed This Encounter  Procedures  . Urinalysis, Routine w reflex microscopic     Return in about 3 months (around 02/06/2021).    CC:  Dr. Jerene Bears.      Irine Seal 11/06/2020 [phone]

## 2020-11-06 ENCOUNTER — Encounter: Payer: Self-pay | Admitting: Urology

## 2020-11-06 ENCOUNTER — Ambulatory Visit (INDEPENDENT_AMBULATORY_CARE_PROVIDER_SITE_OTHER): Payer: Medicare HMO | Admitting: Urology

## 2020-11-06 ENCOUNTER — Other Ambulatory Visit: Payer: Self-pay

## 2020-11-06 VITALS — BP 124/69 | HR 106 | Temp 98.4°F | Ht 63.0 in | Wt 158.0 lb

## 2020-11-06 DIAGNOSIS — N952 Postmenopausal atrophic vaginitis: Secondary | ICD-10-CM | POA: Diagnosis not present

## 2020-11-06 DIAGNOSIS — N390 Urinary tract infection, site not specified: Secondary | ICD-10-CM | POA: Diagnosis not present

## 2020-11-06 DIAGNOSIS — N3946 Mixed incontinence: Secondary | ICD-10-CM

## 2020-11-06 DIAGNOSIS — M47816 Spondylosis without myelopathy or radiculopathy, lumbar region: Secondary | ICD-10-CM | POA: Diagnosis not present

## 2020-11-06 LAB — CULTURE, URINE COMPREHENSIVE

## 2020-11-06 MED ORDER — MIRABEGRON ER 50 MG PO TB24
50.0000 mg | ORAL_TABLET | Freq: Every day | ORAL | 11 refills | Status: DC
Start: 2020-11-06 — End: 2021-06-25

## 2020-11-06 MED ORDER — NITROFURANTOIN MONOHYD MACRO 100 MG PO CAPS: 100.0000 mg | ORAL_CAPSULE | Freq: Two times a day (BID) | ORAL | 0 refills | Status: DC

## 2020-11-06 NOTE — Progress Notes (Signed)
Urological Symptom Review  Patient is experiencing the following symptoms: Burning/pain with urination Get up at night to urinate Leakage of urine   Review of Systems  Gastrointestinal (upper)  : Nausea Indigestion/Heartburn  Gastrointestinal (lower) : Constipation  Constitutional : Negative for symptoms  Skin: Negative for skin symptoms  Eyes: Negative for eye symptoms  Ear/Nose/Throat : Sinus problems  Hematologic/Lymphatic: Negative for Hematologic/Lymphatic symptoms  Cardiovascular : Negative for cardiovascular symptoms  Respiratory : Negative for respiratory symptoms  Endocrine: Negative for endocrine symptoms  Musculoskeletal: Back pain  Neurological: Headaches Dizziness  Psychologic: Depression Anxiety

## 2020-11-07 LAB — URINALYSIS, ROUTINE W REFLEX MICROSCOPIC
Bilirubin, UA: NEGATIVE
Glucose, UA: NEGATIVE
Ketones, UA: NEGATIVE
Leukocytes,UA: NEGATIVE
Nitrite, UA: NEGATIVE
Protein,UA: NEGATIVE
RBC, UA: NEGATIVE
Specific Gravity, UA: 1.015 (ref 1.005–1.030)
Urobilinogen, Ur: 0.2 mg/dL (ref 0.2–1.0)
pH, UA: 7.5 (ref 5.0–7.5)

## 2020-11-20 DIAGNOSIS — E1165 Type 2 diabetes mellitus with hyperglycemia: Secondary | ICD-10-CM | POA: Diagnosis not present

## 2020-11-25 DIAGNOSIS — F1120 Opioid dependence, uncomplicated: Secondary | ICD-10-CM | POA: Diagnosis not present

## 2020-11-25 DIAGNOSIS — M47816 Spondylosis without myelopathy or radiculopathy, lumbar region: Secondary | ICD-10-CM | POA: Diagnosis not present

## 2020-12-19 DIAGNOSIS — E1165 Type 2 diabetes mellitus with hyperglycemia: Secondary | ICD-10-CM | POA: Diagnosis not present

## 2020-12-25 DIAGNOSIS — R413 Other amnesia: Secondary | ICD-10-CM | POA: Diagnosis not present

## 2020-12-25 DIAGNOSIS — Z299 Encounter for prophylactic measures, unspecified: Secondary | ICD-10-CM | POA: Diagnosis not present

## 2020-12-25 DIAGNOSIS — E1165 Type 2 diabetes mellitus with hyperglycemia: Secondary | ICD-10-CM | POA: Diagnosis not present

## 2020-12-25 DIAGNOSIS — I700 Atherosclerosis of aorta: Secondary | ICD-10-CM | POA: Diagnosis not present

## 2020-12-25 DIAGNOSIS — I739 Peripheral vascular disease, unspecified: Secondary | ICD-10-CM | POA: Diagnosis not present

## 2020-12-25 DIAGNOSIS — I10 Essential (primary) hypertension: Secondary | ICD-10-CM | POA: Diagnosis not present

## 2021-01-02 DIAGNOSIS — G4700 Insomnia, unspecified: Secondary | ICD-10-CM | POA: Diagnosis not present

## 2021-01-02 DIAGNOSIS — Z299 Encounter for prophylactic measures, unspecified: Secondary | ICD-10-CM | POA: Diagnosis not present

## 2021-01-02 DIAGNOSIS — F419 Anxiety disorder, unspecified: Secondary | ICD-10-CM | POA: Diagnosis not present

## 2021-01-02 DIAGNOSIS — R079 Chest pain, unspecified: Secondary | ICD-10-CM | POA: Diagnosis not present

## 2021-01-02 DIAGNOSIS — F329 Major depressive disorder, single episode, unspecified: Secondary | ICD-10-CM | POA: Diagnosis not present

## 2021-01-19 DIAGNOSIS — F329 Major depressive disorder, single episode, unspecified: Secondary | ICD-10-CM | POA: Diagnosis not present

## 2021-01-19 DIAGNOSIS — E119 Type 2 diabetes mellitus without complications: Secondary | ICD-10-CM | POA: Diagnosis not present

## 2021-01-19 DIAGNOSIS — I10 Essential (primary) hypertension: Secondary | ICD-10-CM | POA: Diagnosis not present

## 2021-01-19 DIAGNOSIS — E7800 Pure hypercholesterolemia, unspecified: Secondary | ICD-10-CM | POA: Diagnosis not present

## 2021-01-19 DIAGNOSIS — E1165 Type 2 diabetes mellitus with hyperglycemia: Secondary | ICD-10-CM | POA: Diagnosis not present

## 2021-01-26 DIAGNOSIS — M48061 Spinal stenosis, lumbar region without neurogenic claudication: Secondary | ICD-10-CM | POA: Diagnosis not present

## 2021-01-26 DIAGNOSIS — L814 Other melanin hyperpigmentation: Secondary | ICD-10-CM | POA: Diagnosis not present

## 2021-01-26 DIAGNOSIS — L821 Other seborrheic keratosis: Secondary | ICD-10-CM | POA: Diagnosis not present

## 2021-01-26 DIAGNOSIS — M4126 Other idiopathic scoliosis, lumbar region: Secondary | ICD-10-CM | POA: Diagnosis not present

## 2021-01-26 DIAGNOSIS — F1120 Opioid dependence, uncomplicated: Secondary | ICD-10-CM | POA: Diagnosis not present

## 2021-01-28 DIAGNOSIS — Z0000 Encounter for general adult medical examination without abnormal findings: Secondary | ICD-10-CM | POA: Diagnosis not present

## 2021-01-29 ENCOUNTER — Other Ambulatory Visit: Payer: Self-pay

## 2021-01-29 ENCOUNTER — Encounter: Payer: Self-pay | Admitting: Urology

## 2021-01-29 ENCOUNTER — Ambulatory Visit (INDEPENDENT_AMBULATORY_CARE_PROVIDER_SITE_OTHER): Payer: Medicare HMO | Admitting: Urology

## 2021-01-29 ENCOUNTER — Telehealth: Payer: Self-pay

## 2021-01-29 VITALS — BP 147/78 | HR 92 | Ht 63.0 in

## 2021-01-29 DIAGNOSIS — N3946 Mixed incontinence: Secondary | ICD-10-CM

## 2021-01-29 DIAGNOSIS — N952 Postmenopausal atrophic vaginitis: Secondary | ICD-10-CM

## 2021-01-29 DIAGNOSIS — N390 Urinary tract infection, site not specified: Secondary | ICD-10-CM | POA: Diagnosis not present

## 2021-01-29 LAB — URINALYSIS, ROUTINE W REFLEX MICROSCOPIC
Bilirubin, UA: NEGATIVE
Glucose, UA: NEGATIVE
Ketones, UA: NEGATIVE
Nitrite, UA: NEGATIVE
Protein,UA: NEGATIVE
RBC, UA: NEGATIVE
Specific Gravity, UA: 1.015 (ref 1.005–1.030)
Urobilinogen, Ur: 1 mg/dL (ref 0.2–1.0)
pH, UA: 6 (ref 5.0–7.5)

## 2021-01-29 LAB — MICROSCOPIC EXAMINATION
RBC, Urine: NONE SEEN /hpf (ref 0–2)
Renal Epithel, UA: NONE SEEN /hpf

## 2021-01-29 NOTE — Progress Notes (Signed)
Subjective: 1. Mixed incontinence urge and stress   2. Recurrent UTI   3. Vaginal atrophy      01/29/21: Wendy Fuller returns today in f/u.  She remains on Myrbetri1 50mg  daily with some improvement but she still has UUI and is using 3-4 big pads daily.   She has completed the macrobid for the UTI and has no dysuria or hematuria.  Her UA today has 6-10 WBC and few bacteria.  She is not using the premarin cream currently.     11/05/20: Wendy Fuller returns today in f/u.  She had the onset last week of irritative voiding symptoms.  He UA was clear on drop off but she has >100K enterococcus on culture.  She continues to use the premarin cream.  She is otherwise doing better with Myrbetriq 50mg  for the UUI but she still requires pads.  She is out of the Myrbetriq samples.   She has had no hematuria.   UA is clear.   05/09/20: Wendy Fuller returns in f/u.  She was seen in July and was found to have citrobacter that was treated.   She didn't have improvement in her incontinence with antibiotic therapy.  She lost her husband about 3 weeks ago after a battle with COVID.  She is using 1ppd.  It is variably wet.  She has MUI.   She feels she is emptying her bladder.  She has some vaginal irritation.    03/07/20: Wendy Fuller returns today in f/u and was last seen in 2016.   She is back with UTI symptoms today.  She had hematuria yesterday but that has cleared and the urine is dark today.  She had strep on a culture in 10/20.   She has a history of atrophic vaginitis with recurrent UTI's and incomplete emptying. She is no longer on estrace cream or tamsulosin.   She had been on nitrofurantion for suppression but not in some time. She has some  dysuria. She has daytime frequency but only nocturia x 1. She has urgency with UUI and  SUI.   She has had no flank pain or fever.  She is recovering from a right hip fracture and ORIF in 3/21.  ROS:  ROS  Allergies  Allergen Reactions   Penicillins Hives and Rash    Has patient  had a PCN reaction causing immediate rash, facial/tongue/throat swelling, SOB or lightheadedness with hypotension: No Has patient had a PCN reaction causing severe rash involving mucus membranes or skin necrosis: No Has patient had a PCN reaction that required hospitalization: No Has patient had a PCN reaction occurring within the last 10 years: No If all of the above answers are "NO", then may proceed with Cephalosporin use.     Past Medical History:  Diagnosis Date   Arthritis    Back pain    Cataract    Depression    Diabetes ([place])    x 3 yrs   Hypertension    Thyroid disease     Past Surgical History:  Procedure Laterality Date   ABDOMINAL HYSTERECTOMY     BALLOON DILATION N/A 12/21/2013   Procedure: BALLOON DILATION;  Surgeon: Rogene Houston, MD;  [facility];  Service: Endoscopy;  Laterality: N/A;   BIOPSY  06/28/2019   Procedure: BIOPSY;  Surgeon: Rogene Houston, MD;  [facility];  Service: Endoscopy;;  duodenum    CESAREAN SECTION     COLONOSCOPY N/A 06/28/2019   Procedure: COLONOSCOPY;  Surgeon:  Rogene Houston, MD;  [facility];  Service: Endoscopy;  Laterality: N/A;   complete hyster     ESOPHAGEAL DILATION N/A 07/26/2018   Procedure: ESOPHAGEAL DILATION;  Surgeon: Rogene Houston, MD;  [facility];  Service: Endoscopy;  Laterality: N/A;   ESOPHAGOGASTRODUODENOSCOPY N/A 12/21/2013   Procedure: ESOPHAGOGASTRODUODENOSCOPY (EGD);  Surgeon: Rogene Houston, MD;  [facility];  Service: Endoscopy;  Laterality: N/A;  730   ESOPHAGOGASTRODUODENOSCOPY N/A 07/26/2018   Procedure: ESOPHAGOGASTRODUODENOSCOPY (EGD);  Surgeon: Rogene Houston, MD;  [facility];  Service: Endoscopy;  Laterality: N/A;  7:30   ESOPHAGOGASTRODUODENOSCOPY N/A 06/28/2019   Procedure: ESOPHAGOGASTRODUODENOSCOPY (EGD);  Surgeon: Rogene Houston, MD;  [facility];  Service: Endoscopy;  Laterality: N/A;  200pm    ESOPHAGOGASTRODUODENOSCOPY (EGD) WITH ESOPHAGEAL DILATION N/A 08/01/2013   Procedure: ESOPHAGOGASTRODUODENOSCOPY (EGD) WITH ESOPHAGEAL DILATION;  Surgeon: Rogene Houston, MD;  [facility];  Service: Endoscopy;  Laterality: N/A;  345-moved to 100 [place] notified pt   EYE SURGERY     MALONEY DILATION N/A 12/21/2013   Procedure: MALONEY DILATION;  Surgeon: Rogene Houston, MD;  [facility];  Service: Endoscopy;  Laterality: N/A;   ORIF HIP FRACTURE Right 11/18/2019   POLYPECTOMY  06/28/2019   Procedure: POLYPECTOMY;  Surgeon: Rogene Houston, MD;  [facility];  Service: Endoscopy;;  rectosigmoid   SAVORY DILATION N/A 12/21/2013   Procedure: SAVORY DILATION;  Surgeon: Rogene Houston, MD;  [facility];  Service: Endoscopy;  Laterality: N/A;   SHOULDER SURGERY     THYROID SURGERY      Social History   Socioeconomic History   Marital status: Married    Spouse name: Not on file   Number of children: Not on file   Years of education: Not on file   Highest education level: Not on file  Occupational History   Occupation: retired  Tobacco Use   Smoking status: Never   Smokeless tobacco: Never  Vaping Use   Vaping Use: Never used  Substance and Sexual Activity   Alcohol use: No   Drug use: No   Sexual activity: Not Currently  Other Topics Concern   Not on file  Social History Narrative   Not on file   Social Determinants of Health   Financial Resource Strain: Not on file  Food Insecurity: Not on file  Transportation Needs: Not on file  Physical Activity: Not on file  Stress: Not on file  Social Connections: Not on file  Intimate Partner Violence: Not on file    Family History  Problem Relation Age of Onset   Cancer Mother    Diabetes Mother    Hyperlipidemia Mother    Cancer Father    Colon cancer Father     Anti-infectives: Anti-infectives (From admission, onward)    None       Current Outpatient Medications  Medication  Sig Dispense Refill   alendronate (FOSAMAX) 70 MG tablet Take 70 mg by mouth every morning.     aspirin EC 81 MG tablet Take 1 tablet (81 mg total) by mouth daily.     atorvastatin (LIPITOR) 10 MG tablet Take 10 mg by mouth daily.     docusate sodium (COLACE) 100 MG capsule Take 100 mg by mouth 2 (two) times daily.     estradiol (ESTRACE) 0.1 MG/GM vaginal cream Use 0.5gm or a pea sized amount vaginally at bedtime for 2  weeks and then 2-3x weekly. 42.5 g 12   famotidine (PEPCID) 20 MG tablet TAKE ONE CAPSULE BY MOUTH TWICE DAILY 60 tablet 5   glimepiride (AMARYL) 4 MG tablet Take by mouth.     HYDROcodone-acetaminophen (NORCO/VICODIN) 5-325 MG tablet Take 1 tablet by mouth every 6 (six) hours as needed.     lisinopril-hydrochlorothiazide (PRINZIDE,ZESTORETIC) 10-12.5 MG per tablet Take 1 tablet by mouth daily.     mirabegron ER (MYRBETRIQ) 50 MG TB24 tablet Take 1 tablet (50 mg total) by mouth daily. 30 tablet 11   Multiple Vitamin (MULTIVITAMIN WITH MINERALS) TABS tablet Take 1 tablet by mouth daily. ALIVE MULTIVITAMIN     Multiple Vitamins-Minerals (QC DAILY MULTIVIT/MULTIMINERAL) TABS Take by mouth.     ondansetron (ZOFRAN-ODT) 4 MG disintegrating tablet TAKE 1 TABLET BY MOUTH EVERY 8 HOURS AS NEEDED FOR NAUSEA AND VOMITING 30 tablet 1   oxyCODONE-acetaminophen (PERCOCET) 10-325 MG tablet Take by mouth.     pioglitazone (ACTOS) 30 MG tablet Take by mouth.     No current facility-administered medications for this visit.     Objective: Vital signs in last 24 hours: BP (!) 147/78   Pulse 92   Ht 5\' 3"  (1.6 m)   BMI 27.99 kg/m   Intake/Output from previous day: No intake/output data recorded. Intake/Output this shift: @IOTHISSHIFT @   Physical Exam  Lab Results:  Results for orders placed or performed in visit on 01/29/21 (from the past 24 hour(s))  Urinalysis, Routine w reflex microscopic     Status: Abnormal   Collection Time: 01/29/21 10:43 AM  Result Value Ref Range    Specific Gravity, UA 1.015 1.005 - 1.030   pH, UA 6.0 5.0 - 7.5   Color, UA Yellow Yellow   Appearance Ur Clear Clear   Leukocytes,UA 1+ (A) Negative   Protein,UA Negative Negative/Trace   Glucose, UA Negative Negative   Ketones, UA Negative Negative   RBC, UA Negative Negative   Bilirubin, UA Negative Negative   Urobilinogen, Ur 1.0 0.2 - 1.0 mg/dL   Nitrite, UA Negative Negative   Microscopic Examination See below:    Narrative   Performed at:  [facility] [address], [place], [place]  [id number] Lab Director: Mina Marble MT, Phone:  [phone]  Microscopic Examination     Status: Abnormal   Collection Time: 01/29/21 10:43 AM   Urine  Result Value Ref Range   WBC, UA 6-10 (A) 0 - 5 /hpf   RBC None seen 0 - 2 /hpf   Epithelial Cells (non renal) 0-10 0 - 10 /hpf   Renal Epithel, UA None seen None seen /hpf   Bacteria, UA Few None seen/Few   Narrative   Performed at:  [facility] [address] [place], [place]  [id number] Lab Director: Mina Marble MT, Phone:  [phone]    Results for orders placed or performed in visit on 01/29/21 (from the past 72 hour(s))  Urinalysis, Routine w reflex microscopic     Status: Abnormal   Collection Time: 01/29/21 10:43 AM  Result Value Ref Range   Specific Gravity, UA 1.015 1.005 - 1.030   pH, UA 6.0 5.0 - 7.5   Color, UA Yellow Yellow   Appearance Ur Clear Clear   Leukocytes,UA 1+ (A) Negative   Protein,UA Negative Negative/Trace   Glucose, UA Negative Negative   Ketones, UA Negative Negative   RBC, UA Negative Negative   Bilirubin, UA Negative Negative   Urobilinogen, Ur 1.0  0.2 - 1.0 mg/dL   Nitrite, UA Negative Negative   Microscopic Examination See below:   Microscopic Examination     Status: Abnormal   Collection Time: 01/29/21 10:43 AM   Urine  Result Value Ref Range   WBC, UA 6-10 (A) 0 - 5 /hpf   RBC None seen 0 - 2 /hpf   Epithelial Cells (non renal) 0-10 0 - 10 /hpf   Renal  Epithel, UA None seen None seen /hpf   Bacteria, UA Few None seen/Few     Studies/Results: No results found.   Assessment/Plan: 1. MUI.  I will have her stay on Myrbetriq but have her return for PTNS.   2.  Recurrent UTI's.  UA has no worrisome findings and she has no symptoms today.   3.  Atrophic vaginitis.  resume estradiol cream.   No orders of the defined types were placed in this encounter.    Orders Placed This Encounter  Procedures   Microscopic Examination   Urinalysis, Routine w reflex microscopic     Return for Please begin PTNS for 12 weeks.   F/u in 8 weeks to assess response. .    CC:  Dr. Jerene Bears.      Irine Seal 01/29/2021 [phone]

## 2021-01-29 NOTE — Progress Notes (Signed)
Urological Symptom Review  Patient is experiencing the following symptoms: Frequent urination Hard to postpone urination Get up at night to urinate Leakage of urine   Review of Systems  Gastrointestinal (upper)  : Nausea  Gastrointestinal (lower) : Constipation  Constitutional : Negative for symptoms  Skin: Negative for skin symptoms  Eyes: Negative for eye symptoms  Ear/Nose/Throat : Sinus problems  Hematologic/Lymphatic: Negative for Hematologic/Lymphatic symptoms  Cardiovascular : Negative for cardiovascular symptoms  Respiratory : Cough  Endocrine: Negative for endocrine symptoms  Musculoskeletal: Back pain  Neurological: Headaches  Psychologic: Negative for psychiatric symptoms

## 2021-01-29 NOTE — Telephone Encounter (Signed)
-----   Message from Raiford Noble sent at 01/29/2021 11:35 AM EDT ----- Patient needs auth for PTNS please

## 2021-01-29 NOTE — Telephone Encounter (Signed)
Prior authorization submitted for PTNS- pending.

## 2021-02-02 ENCOUNTER — Ambulatory Visit: Payer: Medicare HMO

## 2021-02-03 ENCOUNTER — Other Ambulatory Visit: Payer: Self-pay

## 2021-02-03 DIAGNOSIS — N3946 Mixed incontinence: Secondary | ICD-10-CM

## 2021-02-03 MED ORDER — SOLIFENACIN SUCCINATE 5 MG PO TABS: 5.0000 mg | ORAL_TABLET | Freq: Every day | ORAL | 11 refills | Status: DC

## 2021-02-03 NOTE — Telephone Encounter (Signed)
Daughter called and made aware. She request medication be sent in. Rx sent.

## 2021-02-04 ENCOUNTER — Telehealth: Payer: Self-pay

## 2021-02-04 NOTE — Telephone Encounter (Signed)
Clinicals submitted to [facility] at [phone] Reference # [id number] For PTNS approval

## 2021-02-05 DIAGNOSIS — E7800 Pure hypercholesterolemia, unspecified: Secondary | ICD-10-CM | POA: Diagnosis not present

## 2021-02-05 DIAGNOSIS — Z0000 Encounter for general adult medical examination without abnormal findings: Secondary | ICD-10-CM | POA: Diagnosis not present

## 2021-02-05 DIAGNOSIS — E1165 Type 2 diabetes mellitus with hyperglycemia: Secondary | ICD-10-CM | POA: Diagnosis not present

## 2021-02-05 DIAGNOSIS — Z1339 Encounter for screening examination for other mental health and behavioral disorders: Secondary | ICD-10-CM | POA: Diagnosis not present

## 2021-02-05 DIAGNOSIS — Z1331 Encounter for screening for depression: Secondary | ICD-10-CM | POA: Diagnosis not present

## 2021-02-05 DIAGNOSIS — Z299 Encounter for prophylactic measures, unspecified: Secondary | ICD-10-CM | POA: Diagnosis not present

## 2021-02-05 DIAGNOSIS — I10 Essential (primary) hypertension: Secondary | ICD-10-CM | POA: Diagnosis not present

## 2021-02-05 DIAGNOSIS — R5383 Other fatigue: Secondary | ICD-10-CM | POA: Diagnosis not present

## 2021-02-05 DIAGNOSIS — Z6822 Body mass index (BMI) 22.0-22.9, adult: Secondary | ICD-10-CM | POA: Diagnosis not present

## 2021-02-05 DIAGNOSIS — Z79899 Other long term (current) drug therapy: Secondary | ICD-10-CM | POA: Diagnosis not present

## 2021-02-05 DIAGNOSIS — Z7189 Other specified counseling: Secondary | ICD-10-CM | POA: Diagnosis not present

## 2021-02-11 DIAGNOSIS — R413 Other amnesia: Secondary | ICD-10-CM | POA: Diagnosis not present

## 2021-02-11 DIAGNOSIS — F339 Major depressive disorder, recurrent, unspecified: Secondary | ICD-10-CM | POA: Diagnosis not present

## 2021-02-11 DIAGNOSIS — I700 Atherosclerosis of aorta: Secondary | ICD-10-CM | POA: Diagnosis not present

## 2021-02-11 DIAGNOSIS — I10 Essential (primary) hypertension: Secondary | ICD-10-CM | POA: Diagnosis not present

## 2021-02-11 DIAGNOSIS — Z299 Encounter for prophylactic measures, unspecified: Secondary | ICD-10-CM | POA: Diagnosis not present

## 2021-02-13 ENCOUNTER — Ambulatory Visit: Payer: Medicare HMO

## 2021-02-19 DIAGNOSIS — E1165 Type 2 diabetes mellitus with hyperglycemia: Secondary | ICD-10-CM | POA: Diagnosis not present

## 2021-02-20 ENCOUNTER — Ambulatory Visit: Payer: Medicare HMO

## 2021-02-26 ENCOUNTER — Ambulatory Visit: Payer: Medicare HMO

## 2021-02-27 ENCOUNTER — Ambulatory Visit: Payer: Medicare HMO

## 2021-03-06 ENCOUNTER — Ambulatory Visit: Payer: Medicare HMO

## 2021-03-11 ENCOUNTER — Ambulatory Visit: Payer: Medicare HMO

## 2021-03-11 DIAGNOSIS — F419 Anxiety disorder, unspecified: Secondary | ICD-10-CM | POA: Diagnosis not present

## 2021-03-11 DIAGNOSIS — Z299 Encounter for prophylactic measures, unspecified: Secondary | ICD-10-CM | POA: Diagnosis not present

## 2021-03-11 DIAGNOSIS — E1165 Type 2 diabetes mellitus with hyperglycemia: Secondary | ICD-10-CM | POA: Diagnosis not present

## 2021-03-11 DIAGNOSIS — I10 Essential (primary) hypertension: Secondary | ICD-10-CM | POA: Diagnosis not present

## 2021-03-11 DIAGNOSIS — G4700 Insomnia, unspecified: Secondary | ICD-10-CM | POA: Diagnosis not present

## 2021-03-12 ENCOUNTER — Ambulatory Visit: Payer: Medicare HMO

## 2021-03-20 ENCOUNTER — Ambulatory Visit: Payer: Medicare HMO

## 2021-03-20 DIAGNOSIS — E1165 Type 2 diabetes mellitus with hyperglycemia: Secondary | ICD-10-CM | POA: Diagnosis not present

## 2021-03-26 ENCOUNTER — Ambulatory Visit: Payer: Medicare HMO

## 2021-04-03 ENCOUNTER — Ambulatory Visit: Payer: Medicare HMO

## 2021-04-09 ENCOUNTER — Ambulatory Visit: Payer: Medicare HMO | Admitting: Urology

## 2021-04-10 ENCOUNTER — Ambulatory Visit: Payer: Medicare HMO

## 2021-04-17 ENCOUNTER — Ambulatory Visit: Payer: Medicare HMO

## 2021-04-23 ENCOUNTER — Ambulatory Visit: Payer: Medicare HMO

## 2021-05-01 ENCOUNTER — Ambulatory Visit: Payer: Medicare HMO

## 2021-06-25 ENCOUNTER — Encounter: Payer: Self-pay | Admitting: Urology

## 2021-06-25 ENCOUNTER — Ambulatory Visit: Payer: Medicare PPO | Admitting: Urology

## 2021-06-25 ENCOUNTER — Other Ambulatory Visit: Payer: Self-pay

## 2021-06-25 VITALS — BP 147/91 | HR 99 | Temp 98.1°F

## 2021-06-25 DIAGNOSIS — N952 Postmenopausal atrophic vaginitis: Secondary | ICD-10-CM

## 2021-06-25 DIAGNOSIS — N390 Urinary tract infection, site not specified: Secondary | ICD-10-CM | POA: Diagnosis not present

## 2021-06-25 DIAGNOSIS — N3946 Mixed incontinence: Secondary | ICD-10-CM | POA: Diagnosis not present

## 2021-06-25 LAB — MICROSCOPIC EXAMINATION
RBC, Urine: NONE SEEN /hpf (ref 0–2)
Renal Epithel, UA: NONE SEEN /hpf

## 2021-06-25 LAB — URINALYSIS, ROUTINE W REFLEX MICROSCOPIC
Bilirubin, UA: NEGATIVE
Glucose, UA: NEGATIVE
Ketones, UA: NEGATIVE
Nitrite, UA: NEGATIVE
Protein,UA: NEGATIVE
RBC, UA: NEGATIVE
Specific Gravity, UA: 1.025 (ref 1.005–1.030)
Urobilinogen, Ur: 1 mg/dL (ref 0.2–1.0)
pH, UA: 5.5 (ref 5.0–7.5)

## 2021-06-25 MED ORDER — MIRABEGRON ER 50 MG PO TB24: 50.0000 mg | ORAL_TABLET | Freq: Every day | ORAL | 3 refills | Status: AC

## 2021-06-25 NOTE — Progress Notes (Signed)
Subjective: 1. Mixed incontinence urge and stress   2. Recurrent UTI   3. Vaginal atrophy      01/29/21: Mrs. Kirkendoll returns today in f/u.  She decided not to do PTNS  She remains on Myrbetriq 50mg  daily with some improvement but she still has UUI and is using 3-4 big pads daily.   She has completed the macrobid for the UTI and has no dysuria or hematuria.  Her UA today is unremarkable.  She is using the premarin cream on occasion.  She has no new voiding complaints.   She was hospitalized for 4 days with food poisoning a few weeks ago.       11/05/20: Mrs. Smotherman returns today in f/u.  She had the onset last week of irritative voiding symptoms.  He UA was clear on drop off but she has >100K enterococcus on culture.  She continues to use the premarin cream.  She is otherwise doing better with Myrbetriq 50mg  for the UUI but she still requires pads.  She is out of the Myrbetriq samples.   She has had no hematuria.   UA is clear.   05/09/20: Mrs. Nason returns in f/u.  She was seen in July and was found to have citrobacter that was treated.   She didn't have improvement in her incontinence with antibiotic therapy.  She lost her husband about 3 weeks ago after a battle with COVID.  She is using 1ppd.  It is variably wet.  She has MUI.   She feels she is emptying her bladder.  She has some vaginal irritation.    03/07/20: Mrs. Rothbauer returns today in f/u and was last seen in 2016.   She is back with UTI symptoms today.  She had hematuria yesterday but that has cleared and the urine is dark today.  She had strep on a culture in 10/20.   She has a history of atrophic vaginitis with recurrent UTI's and incomplete emptying. She is no longer on estrace cream or tamsulosin.   She had been on nitrofurantion for suppression but not in some time. She has some  dysuria. She has daytime frequency but only nocturia x 1. She has urgency with UUI and  SUI.   She has had no flank pain or fever.  She is recovering from a right hip  fracture and ORIF in 3/21.  ROS:  ROS  Allergies  Allergen Reactions   Penicillins Hives and Rash    Has patient had a PCN reaction causing immediate rash, facial/tongue/throat swelling, SOB or lightheadedness with hypotension: No Has patient had a PCN reaction causing severe rash involving mucus membranes or skin necrosis: No Has patient had a PCN reaction that required hospitalization: No Has patient had a PCN reaction occurring within the last 10 years: No If all of the above answers are "NO", then may proceed with Cephalosporin use.     Past Medical History:  Diagnosis Date   Arthritis    Back pain    Cataract    Depression    Diabetes ([place])    x 3 yrs   Hypertension    Thyroid disease     Past Surgical History:  Procedure Laterality Date   ABDOMINAL HYSTERECTOMY     BALLOON DILATION N/A 12/21/2013   Procedure: BALLOON DILATION;  Surgeon: Rogene Houston, MD;  [facility];  Service: Endoscopy;  Laterality: N/A;   BIOPSY  06/28/2019   Procedure: BIOPSY;  Surgeon: Rogene Houston, MD;  [facility];  Service: Endoscopy;;  duodenum    CESAREAN SECTION     COLONOSCOPY N/A 06/28/2019   Procedure: COLONOSCOPY;  Surgeon: Rogene Houston, MD;  [facility];  Service: Endoscopy;  Laterality: N/A;   complete hyster     ESOPHAGEAL DILATION N/A 07/26/2018   Procedure: ESOPHAGEAL DILATION;  Surgeon: Rogene Houston, MD;  [facility];  Service: Endoscopy;  Laterality: N/A;   ESOPHAGOGASTRODUODENOSCOPY N/A 12/21/2013   Procedure: ESOPHAGOGASTRODUODENOSCOPY (EGD);  Surgeon: Rogene Houston, MD;  [facility];  Service: Endoscopy;  Laterality: N/A;  730   ESOPHAGOGASTRODUODENOSCOPY N/A 07/26/2018   Procedure: ESOPHAGOGASTRODUODENOSCOPY (EGD);  Surgeon: Rogene Houston, MD;  [facility];  Service: Endoscopy;  Laterality: N/A;  7:30   ESOPHAGOGASTRODUODENOSCOPY N/A 06/28/2019   Procedure: ESOPHAGOGASTRODUODENOSCOPY (EGD);   Surgeon: Rogene Houston, MD;  [facility];  Service: Endoscopy;  Laterality: N/A;  200pm   ESOPHAGOGASTRODUODENOSCOPY (EGD) WITH ESOPHAGEAL DILATION N/A 08/01/2013   Procedure: ESOPHAGOGASTRODUODENOSCOPY (EGD) WITH ESOPHAGEAL DILATION;  Surgeon: Rogene Houston, MD;  [facility];  Service: Endoscopy;  Laterality: N/A;  345-moved to 100 [place] notified pt   EYE SURGERY     MALONEY DILATION N/A 12/21/2013   Procedure: MALONEY DILATION;  Surgeon: Rogene Houston, MD;  [facility];  Service: Endoscopy;  Laterality: N/A;   ORIF HIP FRACTURE Right 11/18/2019   POLYPECTOMY  06/28/2019   Procedure: POLYPECTOMY;  Surgeon: Rogene Houston, MD;  [facility];  Service: Endoscopy;;  rectosigmoid   SAVORY DILATION N/A 12/21/2013   Procedure: SAVORY DILATION;  Surgeon: Rogene Houston, MD;  [facility];  Service: Endoscopy;  Laterality: N/A;   SHOULDER SURGERY     THYROID SURGERY      Social History   Socioeconomic History   Marital status: Married    Spouse name: Not on file   Number of children: Not on file   Years of education: Not on file   Highest education level: Not on file  Occupational History   Occupation: retired  Tobacco Use   Smoking status: Never   Smokeless tobacco: Never  Vaping Use   Vaping Use: Never used  Substance and Sexual Activity   Alcohol use: No   Drug use: No   Sexual activity: Not Currently  Other Topics Concern   Not on file  Social History Narrative   Not on file   Social Determinants of Health   Financial Resource Strain: Not on file  Food Insecurity: Not on file  Transportation Needs: Not on file  Physical Activity: Not on file  Stress: Not on file  Social Connections: Not on file  Intimate Partner Violence: Not on file    Family History  Problem Relation Age of Onset   Cancer Mother    Diabetes Mother    Hyperlipidemia Mother    Cancer Father    Colon cancer Father      Anti-infectives: Anti-infectives (From admission, onward)    None       Current Outpatient Medications  Medication Sig Dispense Refill   alendronate (FOSAMAX) 70 MG tablet Take 70 mg by mouth every morning.     aspirin EC 81 MG tablet Take 1 tablet (81 mg total) by mouth daily.     atorvastatin (LIPITOR) 10 MG tablet Take 10 mg by mouth daily.     docusate sodium (COLACE) 100 MG capsule Take 100 mg by  mouth 2 (two) times daily.     estradiol (ESTRACE) 0.1 MG/GM vaginal cream Use 0.5gm or a pea sized amount vaginally at bedtime for 2 weeks and then 2-3x weekly. 42.5 g 12   famotidine (PEPCID) 20 MG tablet TAKE ONE CAPSULE BY MOUTH TWICE DAILY 60 tablet 5   glimepiride (AMARYL) 4 MG tablet Take by mouth.     HYDROcodone-acetaminophen (NORCO/VICODIN) 5-325 MG tablet Take 1 tablet by mouth every 6 (six) hours as needed.     lisinopril-hydrochlorothiazide (PRINZIDE,ZESTORETIC) 10-12.5 MG per tablet Take 1 tablet by mouth daily.     Multiple Vitamin (MULTIVITAMIN WITH MINERALS) TABS tablet Take 1 tablet by mouth daily. ALIVE MULTIVITAMIN     Multiple Vitamins-Minerals (QC DAILY MULTIVIT/MULTIMINERAL) TABS Take by mouth.     ondansetron (ZOFRAN-ODT) 4 MG disintegrating tablet TAKE 1 TABLET BY MOUTH EVERY 8 HOURS AS NEEDED FOR NAUSEA AND VOMITING 30 tablet 1   oxyCODONE-acetaminophen (PERCOCET) 10-325 MG tablet Take by mouth.     pioglitazone (ACTOS) 30 MG tablet Take by mouth.     mirabegron ER (MYRBETRIQ) 50 MG TB24 tablet Take 1 tablet (50 mg total) by mouth daily. 90 tablet 3   No current facility-administered medications for this visit.     Objective: Vital signs in last 24 hours: BP (!) 147/91   Pulse 99   Temp 98.1 F (36.7 C)   Intake/Output from previous day: No intake/output data recorded. Intake/Output this shift: @IOTHISSHIFT @   Physical Exam  Lab Results:  Results for orders placed or performed in visit on 06/25/21 (from the past 24 hour(s))  Urinalysis,  Routine w reflex microscopic     Status: Abnormal   Collection Time: 06/25/21  1:54 PM  Result Value Ref Range   Specific Gravity, UA 1.025 1.005 - 1.030   pH, UA 5.5 5.0 - 7.5   Color, UA Yellow Yellow   Appearance Ur Clear Clear   Leukocytes,UA Trace (A) Negative   Protein,UA Negative Negative/Trace   Glucose, UA Negative Negative   Ketones, UA Negative Negative   RBC, UA Negative Negative   Bilirubin, UA Negative Negative   Urobilinogen, Ur 1.0 0.2 - 1.0 mg/dL   Nitrite, UA Negative Negative   Microscopic Examination See below:    Narrative   Performed at:  [facility] [address], [place], [place]  [id number] Lab Director: Mina Marble [place], Phone:  [phone]  Microscopic Examination     Status: Abnormal   Collection Time: 06/25/21  1:54 PM   Urine  Result Value Ref Range   WBC, UA 0-5 0 - 5 /hpf   RBC None seen 0 - 2 /hpf   Epithelial Cells (non renal) 0-10 0 - 10 /hpf   Renal Epithel, UA None seen None seen /hpf   Bacteria, UA Few (A) None seen/Few   Narrative   Performed at:  [facility] [address] [place], [place]  [id number] Lab Director: [place], Phone:  [phone]     Results for orders placed or performed in visit on 06/25/21 (from the past 72 hour(s))  Urinalysis, Routine w reflex microscopic     Status: Abnormal   Collection Time: 06/25/21  1:54 PM  Result Value Ref Range   Specific Gravity, UA 1.025 1.005 - 1.030   pH, UA 5.5 5.0 - 7.5   Color, UA Yellow Yellow   Appearance Ur Clear Clear   Leukocytes,UA Trace (A) Negative   Protein,UA Negative Negative/Trace   Glucose,  UA Negative Negative   Ketones, UA Negative Negative   RBC, UA Negative Negative   Bilirubin, UA Negative Negative   Urobilinogen, Ur 1.0 0.2 - 1.0 mg/dL   Nitrite, UA Negative Negative   Microscopic Examination See below:   Microscopic Examination     Status: Abnormal   Collection Time: 06/25/21  1:54 PM   Urine  Result Value Ref  Range   WBC, UA 0-5 0 - 5 /hpf   RBC None seen 0 - 2 /hpf   Epithelial Cells (non renal) 0-10 0 - 10 /hpf   Renal Epithel, UA None seen None seen /hpf   Bacteria, UA Few (A) None seen/Few      Studies/Results: No results found.   Assessment/Plan: 1. MUI.  She is just on the Myrbetriq which I refilled.  The vesicare didn't help.   2.  Recurrent UTI's.  UA has no worrisome findings and she has no symptoms today.   3.  Atrophic vaginitis.  Continue estradiol cream.   Meds ordered this encounter  Medications   mirabegron ER (MYRBETRIQ) 50 MG TB24 tablet    Sig: Take 1 tablet (50 mg total) by mouth daily.    Dispense:  90 tablet    Refill:  3      Orders Placed This Encounter  Procedures   Microscopic Examination   Urinalysis, Routine w reflex microscopic     Return in about 6 months (around 12/23/2021).    CC:  Dr. Jerene Bears.      Irine Seal 06/26/2021 [phone]

## 2021-06-25 NOTE — Progress Notes (Signed)
Urological Symptom Review  Patient is experiencing the following symptoms: Frequent urination Hard to postpone urination Get up at night to urinate Leakage of urine   Review of Systems  Gastrointestinal (upper)  : Negative for upper GI symptoms  Gastrointestinal (lower) : Negative for lower GI symptoms  Constitutional : Negative for symptoms  Skin: Negative for skin symptoms  Eyes: Negative for eye symptoms  Ear/Nose/Throat : Sinus problems  Hematologic/Lymphatic: Negative for Hematologic/Lymphatic symptoms  Cardiovascular : Leg swelling  Respiratory : Negative for respiratory symptoms  Endocrine: Negative for endocrine symptoms  Musculoskeletal: Back pain  Neurological: Negative for neurological symptoms  Psychologic: Depression Anxiety

## 2021-10-14 DIAGNOSIS — M48061 Spinal stenosis, lumbar region without neurogenic claudication: Secondary | ICD-10-CM | POA: Diagnosis not present

## 2021-10-14 DIAGNOSIS — N1832 Chronic kidney disease, stage 3b: Secondary | ICD-10-CM | POA: Diagnosis not present

## 2021-10-14 DIAGNOSIS — Z299 Encounter for prophylactic measures, unspecified: Secondary | ICD-10-CM | POA: Diagnosis not present

## 2021-10-14 DIAGNOSIS — R809 Proteinuria, unspecified: Secondary | ICD-10-CM | POA: Diagnosis not present

## 2021-10-14 DIAGNOSIS — I739 Peripheral vascular disease, unspecified: Secondary | ICD-10-CM | POA: Diagnosis not present

## 2021-10-14 DIAGNOSIS — E1129 Type 2 diabetes mellitus with other diabetic kidney complication: Secondary | ICD-10-CM | POA: Diagnosis not present

## 2021-10-14 DIAGNOSIS — M4126 Other idiopathic scoliosis, lumbar region: Secondary | ICD-10-CM | POA: Diagnosis not present

## 2021-10-14 DIAGNOSIS — I10 Essential (primary) hypertension: Secondary | ICD-10-CM | POA: Diagnosis not present

## 2021-10-14 DIAGNOSIS — F1120 Opioid dependence, uncomplicated: Secondary | ICD-10-CM | POA: Diagnosis not present

## 2021-10-20 DIAGNOSIS — F329 Major depressive disorder, single episode, unspecified: Secondary | ICD-10-CM | POA: Diagnosis not present

## 2021-10-20 DIAGNOSIS — E1165 Type 2 diabetes mellitus with hyperglycemia: Secondary | ICD-10-CM | POA: Diagnosis not present

## 2021-10-20 DIAGNOSIS — E7800 Pure hypercholesterolemia, unspecified: Secondary | ICD-10-CM | POA: Diagnosis not present

## 2021-10-20 DIAGNOSIS — E119 Type 2 diabetes mellitus without complications: Secondary | ICD-10-CM | POA: Diagnosis not present

## 2021-10-29 DIAGNOSIS — K219 Gastro-esophageal reflux disease without esophagitis: Secondary | ICD-10-CM | POA: Diagnosis not present

## 2021-10-29 DIAGNOSIS — E119 Type 2 diabetes mellitus without complications: Secondary | ICD-10-CM | POA: Diagnosis not present

## 2021-10-29 DIAGNOSIS — R0789 Other chest pain: Secondary | ICD-10-CM | POA: Diagnosis not present

## 2021-10-29 DIAGNOSIS — R110 Nausea: Secondary | ICD-10-CM | POA: Diagnosis not present

## 2021-11-19 DIAGNOSIS — E1165 Type 2 diabetes mellitus with hyperglycemia: Secondary | ICD-10-CM | POA: Diagnosis not present

## 2021-11-26 DIAGNOSIS — I10 Essential (primary) hypertension: Secondary | ICD-10-CM | POA: Diagnosis not present

## 2021-11-26 DIAGNOSIS — E785 Hyperlipidemia, unspecified: Secondary | ICD-10-CM | POA: Diagnosis not present

## 2021-11-26 DIAGNOSIS — K219 Gastro-esophageal reflux disease without esophagitis: Secondary | ICD-10-CM | POA: Diagnosis not present

## 2021-11-26 DIAGNOSIS — E119 Type 2 diabetes mellitus without complications: Secondary | ICD-10-CM | POA: Diagnosis not present

## 2021-11-26 DIAGNOSIS — M810 Age-related osteoporosis without current pathological fracture: Secondary | ICD-10-CM | POA: Diagnosis not present

## 2021-12-03 DIAGNOSIS — R110 Nausea: Secondary | ICD-10-CM | POA: Diagnosis not present

## 2021-12-03 DIAGNOSIS — R109 Unspecified abdominal pain: Secondary | ICD-10-CM | POA: Diagnosis not present

## 2021-12-03 DIAGNOSIS — G43019 Migraine without aura, intractable, without status migrainosus: Secondary | ICD-10-CM | POA: Diagnosis not present

## 2021-12-03 DIAGNOSIS — F419 Anxiety disorder, unspecified: Secondary | ICD-10-CM | POA: Diagnosis not present

## 2021-12-15 DIAGNOSIS — R600 Localized edema: Secondary | ICD-10-CM | POA: Diagnosis not present

## 2021-12-15 DIAGNOSIS — G894 Chronic pain syndrome: Secondary | ICD-10-CM | POA: Diagnosis not present

## 2021-12-15 DIAGNOSIS — R110 Nausea: Secondary | ICD-10-CM | POA: Diagnosis not present

## 2021-12-15 DIAGNOSIS — R202 Paresthesia of skin: Secondary | ICD-10-CM | POA: Diagnosis not present

## 2021-12-20 DIAGNOSIS — E119 Type 2 diabetes mellitus without complications: Secondary | ICD-10-CM | POA: Diagnosis not present

## 2021-12-20 DIAGNOSIS — F329 Major depressive disorder, single episode, unspecified: Secondary | ICD-10-CM | POA: Diagnosis not present

## 2021-12-20 DIAGNOSIS — E1165 Type 2 diabetes mellitus with hyperglycemia: Secondary | ICD-10-CM | POA: Diagnosis not present

## 2021-12-20 DIAGNOSIS — E7800 Pure hypercholesterolemia, unspecified: Secondary | ICD-10-CM | POA: Diagnosis not present

## 2021-12-24 ENCOUNTER — Ambulatory Visit: Payer: Medicare PPO | Admitting: Urology

## 2022-01-08 DIAGNOSIS — R1311 Dysphagia, oral phase: Secondary | ICD-10-CM | POA: Diagnosis not present

## 2022-01-08 DIAGNOSIS — G8929 Other chronic pain: Secondary | ICD-10-CM | POA: Diagnosis not present

## 2022-01-08 DIAGNOSIS — Z6829 Body mass index (BMI) 29.0-29.9, adult: Secondary | ICD-10-CM | POA: Diagnosis not present

## 2022-01-08 DIAGNOSIS — K5900 Constipation, unspecified: Secondary | ICD-10-CM | POA: Diagnosis not present

## 2022-01-28 DIAGNOSIS — N1832 Chronic kidney disease, stage 3b: Secondary | ICD-10-CM | POA: Diagnosis not present

## 2022-01-28 DIAGNOSIS — Z299 Encounter for prophylactic measures, unspecified: Secondary | ICD-10-CM | POA: Diagnosis not present

## 2022-01-28 DIAGNOSIS — E1165 Type 2 diabetes mellitus with hyperglycemia: Secondary | ICD-10-CM | POA: Diagnosis not present

## 2022-01-28 DIAGNOSIS — I10 Essential (primary) hypertension: Secondary | ICD-10-CM | POA: Diagnosis not present

## 2022-02-24 DIAGNOSIS — R519 Headache, unspecified: Secondary | ICD-10-CM | POA: Diagnosis not present

## 2022-02-24 DIAGNOSIS — R252 Cramp and spasm: Secondary | ICD-10-CM | POA: Diagnosis not present

## 2022-02-24 DIAGNOSIS — R1311 Dysphagia, oral phase: Secondary | ICD-10-CM | POA: Diagnosis not present

## 2022-02-24 DIAGNOSIS — M5459 Other low back pain: Secondary | ICD-10-CM | POA: Diagnosis not present

## 2022-03-22 DIAGNOSIS — H9190 Unspecified hearing loss, unspecified ear: Secondary | ICD-10-CM | POA: Diagnosis not present

## 2022-03-22 DIAGNOSIS — H6121 Impacted cerumen, right ear: Secondary | ICD-10-CM | POA: Diagnosis not present

## 2022-03-29 DIAGNOSIS — H6091 Unspecified otitis externa, right ear: Secondary | ICD-10-CM | POA: Diagnosis not present

## 2022-03-29 DIAGNOSIS — H6121 Impacted cerumen, right ear: Secondary | ICD-10-CM | POA: Diagnosis not present

## 2022-03-29 DIAGNOSIS — H9191 Unspecified hearing loss, right ear: Secondary | ICD-10-CM | POA: Diagnosis not present

## 2022-04-02 DIAGNOSIS — R519 Headache, unspecified: Secondary | ICD-10-CM | POA: Diagnosis not present

## 2022-04-02 DIAGNOSIS — R252 Cramp and spasm: Secondary | ICD-10-CM | POA: Diagnosis not present

## 2022-04-02 DIAGNOSIS — R059 Cough, unspecified: Secondary | ICD-10-CM | POA: Diagnosis not present

## 2022-04-02 DIAGNOSIS — E119 Type 2 diabetes mellitus without complications: Secondary | ICD-10-CM | POA: Diagnosis not present

## 2022-04-06 DIAGNOSIS — R1319 Other dysphagia: Secondary | ICD-10-CM | POA: Diagnosis not present

## 2022-04-06 DIAGNOSIS — K219 Gastro-esophageal reflux disease without esophagitis: Secondary | ICD-10-CM | POA: Diagnosis not present

## 2022-04-07 DIAGNOSIS — K5732 Diverticulitis of large intestine without perforation or abscess without bleeding: Secondary | ICD-10-CM | POA: Diagnosis not present

## 2022-04-07 DIAGNOSIS — K625 Hemorrhage of anus and rectum: Secondary | ICD-10-CM | POA: Diagnosis not present

## 2022-04-07 DIAGNOSIS — Z743 Need for continuous supervision: Secondary | ICD-10-CM | POA: Diagnosis not present

## 2022-04-20 DIAGNOSIS — E785 Hyperlipidemia, unspecified: Secondary | ICD-10-CM | POA: Diagnosis not present

## 2022-04-20 DIAGNOSIS — N390 Urinary tract infection, site not specified: Secondary | ICD-10-CM | POA: Diagnosis not present

## 2022-04-20 DIAGNOSIS — I10 Essential (primary) hypertension: Secondary | ICD-10-CM | POA: Diagnosis not present

## 2022-04-20 DIAGNOSIS — E119 Type 2 diabetes mellitus without complications: Secondary | ICD-10-CM | POA: Diagnosis not present

## 2022-04-21 DIAGNOSIS — K222 Esophageal obstruction: Secondary | ICD-10-CM | POA: Diagnosis not present

## 2022-04-21 DIAGNOSIS — R1310 Dysphagia, unspecified: Secondary | ICD-10-CM | POA: Diagnosis not present

## 2022-04-21 DIAGNOSIS — Z7982 Long term (current) use of aspirin: Secondary | ICD-10-CM | POA: Diagnosis not present

## 2022-04-21 DIAGNOSIS — K449 Diaphragmatic hernia without obstruction or gangrene: Secondary | ICD-10-CM | POA: Diagnosis not present

## 2022-04-21 DIAGNOSIS — F329 Major depressive disorder, single episode, unspecified: Secondary | ICD-10-CM | POA: Diagnosis not present

## 2022-04-21 DIAGNOSIS — K219 Gastro-esophageal reflux disease without esophagitis: Secondary | ICD-10-CM | POA: Diagnosis not present

## 2022-04-21 DIAGNOSIS — I10 Essential (primary) hypertension: Secondary | ICD-10-CM | POA: Diagnosis not present

## 2022-04-21 DIAGNOSIS — E785 Hyperlipidemia, unspecified: Secondary | ICD-10-CM | POA: Diagnosis not present

## 2022-04-23 DIAGNOSIS — H90A21 Sensorineural hearing loss, unilateral, right ear, with restricted hearing on the contralateral side: Secondary | ICD-10-CM | POA: Diagnosis not present

## 2022-04-23 DIAGNOSIS — H6091 Unspecified otitis externa, right ear: Secondary | ICD-10-CM | POA: Diagnosis not present

## 2022-04-23 DIAGNOSIS — H903 Sensorineural hearing loss, bilateral: Secondary | ICD-10-CM | POA: Diagnosis not present

## 2022-05-03 DIAGNOSIS — M4800 Spinal stenosis, site unspecified: Secondary | ICD-10-CM | POA: Diagnosis not present

## 2022-05-03 DIAGNOSIS — R519 Headache, unspecified: Secondary | ICD-10-CM | POA: Diagnosis not present

## 2022-05-03 DIAGNOSIS — R059 Cough, unspecified: Secondary | ICD-10-CM | POA: Diagnosis not present

## 2022-05-03 DIAGNOSIS — E119 Type 2 diabetes mellitus without complications: Secondary | ICD-10-CM | POA: Diagnosis not present

## 2022-05-10 DIAGNOSIS — I6523 Occlusion and stenosis of bilateral carotid arteries: Secondary | ICD-10-CM | POA: Diagnosis not present

## 2022-05-10 DIAGNOSIS — I6782 Cerebral ischemia: Secondary | ICD-10-CM | POA: Diagnosis not present

## 2022-05-31 DIAGNOSIS — H6091 Unspecified otitis externa, right ear: Secondary | ICD-10-CM | POA: Diagnosis not present

## 2022-05-31 DIAGNOSIS — H90A21 Sensorineural hearing loss, unilateral, right ear, with restricted hearing on the contralateral side: Secondary | ICD-10-CM | POA: Diagnosis not present

## 2022-05-31 DIAGNOSIS — L299 Pruritus, unspecified: Secondary | ICD-10-CM | POA: Diagnosis not present

## 2022-05-31 DIAGNOSIS — H903 Sensorineural hearing loss, bilateral: Secondary | ICD-10-CM | POA: Diagnosis not present

## 2022-06-11 DIAGNOSIS — M4800 Spinal stenosis, site unspecified: Secondary | ICD-10-CM | POA: Diagnosis not present

## 2022-06-11 DIAGNOSIS — K219 Gastro-esophageal reflux disease without esophagitis: Secondary | ICD-10-CM | POA: Diagnosis not present

## 2022-06-11 DIAGNOSIS — R519 Headache, unspecified: Secondary | ICD-10-CM | POA: Diagnosis not present

## 2022-06-11 DIAGNOSIS — E119 Type 2 diabetes mellitus without complications: Secondary | ICD-10-CM | POA: Diagnosis not present

## 2022-06-11 DIAGNOSIS — Z23 Encounter for immunization: Secondary | ICD-10-CM | POA: Diagnosis not present

## 2022-07-08 DIAGNOSIS — R5383 Other fatigue: Secondary | ICD-10-CM | POA: Diagnosis not present

## 2022-07-08 DIAGNOSIS — R5381 Other malaise: Secondary | ICD-10-CM | POA: Diagnosis not present

## 2022-07-08 DIAGNOSIS — E119 Type 2 diabetes mellitus without complications: Secondary | ICD-10-CM | POA: Diagnosis not present

## 2022-07-08 DIAGNOSIS — R059 Cough, unspecified: Secondary | ICD-10-CM | POA: Diagnosis not present

## 2022-07-08 DIAGNOSIS — G894 Chronic pain syndrome: Secondary | ICD-10-CM | POA: Diagnosis not present

## 2022-07-08 DIAGNOSIS — E785 Hyperlipidemia, unspecified: Secondary | ICD-10-CM | POA: Diagnosis not present

## 2022-07-08 DIAGNOSIS — L989 Disorder of the skin and subcutaneous tissue, unspecified: Secondary | ICD-10-CM | POA: Diagnosis not present

## 2022-07-08 DIAGNOSIS — I10 Essential (primary) hypertension: Secondary | ICD-10-CM | POA: Diagnosis not present

## 2022-07-08 DIAGNOSIS — R1311 Dysphagia, oral phase: Secondary | ICD-10-CM | POA: Diagnosis not present

## 2022-07-13 DIAGNOSIS — K5730 Diverticulosis of large intestine without perforation or abscess without bleeding: Secondary | ICD-10-CM | POA: Diagnosis not present

## 2022-07-13 DIAGNOSIS — R933 Abnormal findings on diagnostic imaging of other parts of digestive tract: Secondary | ICD-10-CM | POA: Diagnosis not present

## 2022-07-13 DIAGNOSIS — Z7982 Long term (current) use of aspirin: Secondary | ICD-10-CM | POA: Diagnosis not present

## 2022-07-13 DIAGNOSIS — K219 Gastro-esophageal reflux disease without esophagitis: Secondary | ICD-10-CM | POA: Diagnosis not present

## 2022-07-13 DIAGNOSIS — E785 Hyperlipidemia, unspecified: Secondary | ICD-10-CM | POA: Diagnosis not present

## 2022-07-13 DIAGNOSIS — E119 Type 2 diabetes mellitus without complications: Secondary | ICD-10-CM | POA: Diagnosis not present

## 2022-07-13 DIAGNOSIS — I10 Essential (primary) hypertension: Secondary | ICD-10-CM | POA: Diagnosis not present

## 2022-07-29 DIAGNOSIS — E785 Hyperlipidemia, unspecified: Secondary | ICD-10-CM | POA: Diagnosis not present

## 2022-07-29 DIAGNOSIS — F17210 Nicotine dependence, cigarettes, uncomplicated: Secondary | ICD-10-CM | POA: Diagnosis not present

## 2022-07-29 DIAGNOSIS — E119 Type 2 diabetes mellitus without complications: Secondary | ICD-10-CM | POA: Diagnosis not present

## 2022-07-29 DIAGNOSIS — N390 Urinary tract infection, site not specified: Secondary | ICD-10-CM | POA: Diagnosis not present

## 2022-08-03 DIAGNOSIS — N390 Urinary tract infection, site not specified: Secondary | ICD-10-CM | POA: Diagnosis not present

## 2022-08-09 DIAGNOSIS — E785 Hyperlipidemia, unspecified: Secondary | ICD-10-CM | POA: Diagnosis not present

## 2022-08-09 DIAGNOSIS — N390 Urinary tract infection, site not specified: Secondary | ICD-10-CM | POA: Diagnosis not present

## 2022-08-09 DIAGNOSIS — M4800 Spinal stenosis, site unspecified: Secondary | ICD-10-CM | POA: Diagnosis not present

## 2022-08-09 DIAGNOSIS — E119 Type 2 diabetes mellitus without complications: Secondary | ICD-10-CM | POA: Diagnosis not present

## 2022-08-24 DIAGNOSIS — D0439 Carcinoma in situ of skin of other parts of face: Secondary | ICD-10-CM | POA: Diagnosis not present

## 2022-08-24 DIAGNOSIS — D485 Neoplasm of uncertain behavior of skin: Secondary | ICD-10-CM | POA: Diagnosis not present

## 2022-08-24 DIAGNOSIS — L814 Other melanin hyperpigmentation: Secondary | ICD-10-CM | POA: Diagnosis not present

## 2022-08-24 DIAGNOSIS — L821 Other seborrheic keratosis: Secondary | ICD-10-CM | POA: Diagnosis not present

## 2022-08-24 DIAGNOSIS — L570 Actinic keratosis: Secondary | ICD-10-CM | POA: Diagnosis not present

## 2022-09-01 DIAGNOSIS — N390 Urinary tract infection, site not specified: Secondary | ICD-10-CM | POA: Diagnosis not present

## 2022-09-13 DIAGNOSIS — R519 Headache, unspecified: Secondary | ICD-10-CM | POA: Diagnosis not present

## 2022-09-13 DIAGNOSIS — C4490 Unspecified malignant neoplasm of skin, unspecified: Secondary | ICD-10-CM | POA: Diagnosis not present

## 2022-09-13 DIAGNOSIS — R1311 Dysphagia, oral phase: Secondary | ICD-10-CM | POA: Diagnosis not present

## 2022-09-13 DIAGNOSIS — E1169 Type 2 diabetes mellitus with other specified complication: Secondary | ICD-10-CM | POA: Diagnosis not present

## 2022-10-04 DIAGNOSIS — D0339 Melanoma in situ of other parts of face: Secondary | ICD-10-CM | POA: Diagnosis not present

## 2022-10-04 DIAGNOSIS — L570 Actinic keratosis: Secondary | ICD-10-CM | POA: Diagnosis not present

## 2022-10-04 DIAGNOSIS — L905 Scar conditions and fibrosis of skin: Secondary | ICD-10-CM | POA: Diagnosis not present

## 2022-10-19 DIAGNOSIS — Z86006 Personal history of melanoma in-situ: Secondary | ICD-10-CM | POA: Diagnosis not present

## 2022-10-19 DIAGNOSIS — D0339 Melanoma in situ of other parts of face: Secondary | ICD-10-CM | POA: Diagnosis not present

## 2022-10-22 DIAGNOSIS — I10 Essential (primary) hypertension: Secondary | ICD-10-CM | POA: Diagnosis not present

## 2022-10-22 DIAGNOSIS — E119 Type 2 diabetes mellitus without complications: Secondary | ICD-10-CM | POA: Diagnosis not present

## 2022-10-22 DIAGNOSIS — G43909 Migraine, unspecified, not intractable, without status migrainosus: Secondary | ICD-10-CM | POA: Diagnosis not present

## 2022-10-25 DIAGNOSIS — D0339 Melanoma in situ of other parts of face: Secondary | ICD-10-CM | POA: Diagnosis not present

## 2022-10-25 DIAGNOSIS — N390 Urinary tract infection, site not specified: Secondary | ICD-10-CM | POA: Diagnosis not present

## 2022-10-25 DIAGNOSIS — N3946 Mixed incontinence: Secondary | ICD-10-CM | POA: Diagnosis not present

## 2022-10-25 DIAGNOSIS — N362 Urethral caruncle: Secondary | ICD-10-CM | POA: Diagnosis not present

## 2022-10-25 DIAGNOSIS — N952 Postmenopausal atrophic vaginitis: Secondary | ICD-10-CM | POA: Diagnosis not present

## 2022-10-25 DIAGNOSIS — N3941 Urge incontinence: Secondary | ICD-10-CM | POA: Diagnosis not present

## 2022-10-26 DIAGNOSIS — E785 Hyperlipidemia, unspecified: Secondary | ICD-10-CM | POA: Diagnosis not present

## 2022-10-26 DIAGNOSIS — K219 Gastro-esophageal reflux disease without esophagitis: Secondary | ICD-10-CM | POA: Diagnosis not present

## 2022-10-26 DIAGNOSIS — E119 Type 2 diabetes mellitus without complications: Secondary | ICD-10-CM | POA: Diagnosis not present

## 2022-10-26 DIAGNOSIS — I10 Essential (primary) hypertension: Secondary | ICD-10-CM | POA: Diagnosis not present

## 2022-10-26 DIAGNOSIS — M810 Age-related osteoporosis without current pathological fracture: Secondary | ICD-10-CM | POA: Diagnosis not present

## 2022-10-26 DIAGNOSIS — R110 Nausea: Secondary | ICD-10-CM | POA: Diagnosis not present

## 2022-10-26 DIAGNOSIS — F411 Generalized anxiety disorder: Secondary | ICD-10-CM | POA: Diagnosis not present

## 2022-11-22 DIAGNOSIS — T8189XA Other complications of procedures, not elsewhere classified, initial encounter: Secondary | ICD-10-CM | POA: Diagnosis not present

## 2022-11-22 DIAGNOSIS — Z86006 Personal history of melanoma in-situ: Secondary | ICD-10-CM | POA: Diagnosis not present

## 2022-11-24 DIAGNOSIS — H903 Sensorineural hearing loss, bilateral: Secondary | ICD-10-CM | POA: Diagnosis not present

## 2022-11-24 DIAGNOSIS — H6121 Impacted cerumen, right ear: Secondary | ICD-10-CM | POA: Diagnosis not present

## 2022-12-06 DIAGNOSIS — N390 Urinary tract infection, site not specified: Secondary | ICD-10-CM | POA: Diagnosis not present

## 2022-12-06 DIAGNOSIS — N362 Urethral caruncle: Secondary | ICD-10-CM | POA: Diagnosis not present

## 2022-12-06 DIAGNOSIS — N3941 Urge incontinence: Secondary | ICD-10-CM | POA: Diagnosis not present

## 2022-12-06 DIAGNOSIS — N952 Postmenopausal atrophic vaginitis: Secondary | ICD-10-CM | POA: Diagnosis not present

## 2022-12-19 DIAGNOSIS — T1490XA Injury, unspecified, initial encounter: Secondary | ICD-10-CM | POA: Diagnosis not present

## 2022-12-19 DIAGNOSIS — F419 Anxiety disorder, unspecified: Secondary | ICD-10-CM | POA: Diagnosis not present

## 2022-12-19 DIAGNOSIS — M25551 Pain in right hip: Secondary | ICD-10-CM | POA: Diagnosis not present

## 2022-12-19 DIAGNOSIS — R002 Palpitations: Secondary | ICD-10-CM | POA: Diagnosis not present

## 2022-12-19 DIAGNOSIS — I959 Hypotension, unspecified: Secondary | ICD-10-CM | POA: Diagnosis not present

## 2022-12-19 DIAGNOSIS — I10 Essential (primary) hypertension: Secondary | ICD-10-CM | POA: Diagnosis not present

## 2022-12-19 DIAGNOSIS — N179 Acute kidney failure, unspecified: Secondary | ICD-10-CM | POA: Diagnosis not present

## 2022-12-19 DIAGNOSIS — E871 Hypo-osmolality and hyponatremia: Secondary | ICD-10-CM | POA: Diagnosis not present

## 2022-12-19 DIAGNOSIS — K219 Gastro-esophageal reflux disease without esophagitis: Secondary | ICD-10-CM | POA: Diagnosis not present

## 2022-12-19 DIAGNOSIS — E119 Type 2 diabetes mellitus without complications: Secondary | ICD-10-CM | POA: Diagnosis not present

## 2022-12-19 DIAGNOSIS — R42 Dizziness and giddiness: Secondary | ICD-10-CM | POA: Diagnosis not present

## 2022-12-19 DIAGNOSIS — E785 Hyperlipidemia, unspecified: Secondary | ICD-10-CM | POA: Diagnosis not present

## 2022-12-19 DIAGNOSIS — W01198A Fall on same level from slipping, tripping and stumbling with subsequent striking against other object, initial encounter: Secondary | ICD-10-CM | POA: Diagnosis not present

## 2022-12-19 DIAGNOSIS — W19XXXA Unspecified fall, initial encounter: Secondary | ICD-10-CM | POA: Diagnosis not present

## 2022-12-19 DIAGNOSIS — Z9989 Dependence on other enabling machines and devices: Secondary | ICD-10-CM | POA: Diagnosis not present

## 2022-12-19 DIAGNOSIS — S01111A Laceration without foreign body of right eyelid and periocular area, initial encounter: Secondary | ICD-10-CM | POA: Diagnosis not present

## 2022-12-20 DIAGNOSIS — N179 Acute kidney failure, unspecified: Secondary | ICD-10-CM | POA: Diagnosis not present

## 2022-12-20 DIAGNOSIS — R42 Dizziness and giddiness: Secondary | ICD-10-CM | POA: Diagnosis not present

## 2022-12-20 DIAGNOSIS — W19XXXA Unspecified fall, initial encounter: Secondary | ICD-10-CM | POA: Diagnosis not present

## 2022-12-20 DIAGNOSIS — R55 Syncope and collapse: Secondary | ICD-10-CM | POA: Diagnosis not present

## 2022-12-20 DIAGNOSIS — E871 Hypo-osmolality and hyponatremia: Secondary | ICD-10-CM | POA: Diagnosis not present

## 2022-12-20 DIAGNOSIS — E119 Type 2 diabetes mellitus without complications: Secondary | ICD-10-CM | POA: Diagnosis not present

## 2022-12-20 DIAGNOSIS — T1490XA Injury, unspecified, initial encounter: Secondary | ICD-10-CM | POA: Diagnosis not present

## 2022-12-21 DIAGNOSIS — T1490XA Injury, unspecified, initial encounter: Secondary | ICD-10-CM | POA: Diagnosis not present

## 2022-12-21 DIAGNOSIS — W19XXXA Unspecified fall, initial encounter: Secondary | ICD-10-CM | POA: Diagnosis not present

## 2022-12-21 DIAGNOSIS — N179 Acute kidney failure, unspecified: Secondary | ICD-10-CM | POA: Diagnosis not present

## 2022-12-21 DIAGNOSIS — R42 Dizziness and giddiness: Secondary | ICD-10-CM | POA: Diagnosis not present

## 2022-12-21 DIAGNOSIS — E119 Type 2 diabetes mellitus without complications: Secondary | ICD-10-CM | POA: Diagnosis not present

## 2022-12-21 DIAGNOSIS — E871 Hypo-osmolality and hyponatremia: Secondary | ICD-10-CM | POA: Diagnosis not present

## 2022-12-28 DIAGNOSIS — N179 Acute kidney failure, unspecified: Secondary | ICD-10-CM | POA: Diagnosis not present

## 2022-12-28 DIAGNOSIS — E860 Dehydration: Secondary | ICD-10-CM | POA: Diagnosis not present

## 2022-12-28 DIAGNOSIS — W19XXXA Unspecified fall, initial encounter: Secondary | ICD-10-CM | POA: Diagnosis not present

## 2022-12-28 DIAGNOSIS — S01111A Laceration without foreign body of right eyelid and periocular area, initial encounter: Secondary | ICD-10-CM | POA: Diagnosis not present

## 2023-01-20 DIAGNOSIS — I10 Essential (primary) hypertension: Secondary | ICD-10-CM | POA: Diagnosis not present

## 2023-01-28 DIAGNOSIS — R2242 Localized swelling, mass and lump, left lower limb: Secondary | ICD-10-CM | POA: Diagnosis not present

## 2023-01-28 DIAGNOSIS — Z1331 Encounter for screening for depression: Secondary | ICD-10-CM | POA: Diagnosis not present

## 2023-01-28 DIAGNOSIS — E119 Type 2 diabetes mellitus without complications: Secondary | ICD-10-CM | POA: Diagnosis not present

## 2023-01-28 DIAGNOSIS — R519 Headache, unspecified: Secondary | ICD-10-CM | POA: Diagnosis not present

## 2023-01-28 DIAGNOSIS — R269 Unspecified abnormalities of gait and mobility: Secondary | ICD-10-CM | POA: Diagnosis not present

## 2023-01-28 DIAGNOSIS — E785 Hyperlipidemia, unspecified: Secondary | ICD-10-CM | POA: Diagnosis not present

## 2023-01-28 DIAGNOSIS — Z0000 Encounter for general adult medical examination without abnormal findings: Secondary | ICD-10-CM | POA: Diagnosis not present

## 2023-01-28 DIAGNOSIS — K219 Gastro-esophageal reflux disease without esophagitis: Secondary | ICD-10-CM | POA: Diagnosis not present

## 2023-01-28 DIAGNOSIS — I10 Essential (primary) hypertension: Secondary | ICD-10-CM | POA: Diagnosis not present

## 2023-02-04 DIAGNOSIS — N3642 Intrinsic sphincter deficiency (ISD): Secondary | ICD-10-CM | POA: Diagnosis not present

## 2023-02-04 DIAGNOSIS — N3946 Mixed incontinence: Secondary | ICD-10-CM | POA: Diagnosis not present

## 2023-02-04 DIAGNOSIS — N390 Urinary tract infection, site not specified: Secondary | ICD-10-CM | POA: Diagnosis not present

## 2023-02-16 DIAGNOSIS — E119 Type 2 diabetes mellitus without complications: Secondary | ICD-10-CM | POA: Diagnosis not present

## 2023-02-16 DIAGNOSIS — I10 Essential (primary) hypertension: Secondary | ICD-10-CM | POA: Diagnosis not present

## 2023-03-01 DIAGNOSIS — E785 Hyperlipidemia, unspecified: Secondary | ICD-10-CM | POA: Diagnosis not present

## 2023-03-01 DIAGNOSIS — M549 Dorsalgia, unspecified: Secondary | ICD-10-CM | POA: Diagnosis not present

## 2023-03-01 DIAGNOSIS — M419 Scoliosis, unspecified: Secondary | ICD-10-CM | POA: Diagnosis not present

## 2023-03-01 DIAGNOSIS — R32 Unspecified urinary incontinence: Secondary | ICD-10-CM | POA: Diagnosis not present

## 2023-03-01 DIAGNOSIS — Z8781 Personal history of (healed) traumatic fracture: Secondary | ICD-10-CM | POA: Diagnosis not present

## 2023-03-01 DIAGNOSIS — K5730 Diverticulosis of large intestine without perforation or abscess without bleeding: Secondary | ICD-10-CM | POA: Diagnosis not present

## 2023-03-01 DIAGNOSIS — Z87440 Personal history of urinary (tract) infections: Secondary | ICD-10-CM | POA: Diagnosis not present

## 2023-03-01 DIAGNOSIS — K219 Gastro-esophageal reflux disease without esophagitis: Secondary | ICD-10-CM | POA: Diagnosis not present

## 2023-03-01 DIAGNOSIS — N393 Stress incontinence (female) (male): Secondary | ICD-10-CM | POA: Diagnosis not present

## 2023-03-01 DIAGNOSIS — I10 Essential (primary) hypertension: Secondary | ICD-10-CM | POA: Diagnosis not present

## 2023-03-01 DIAGNOSIS — N3642 Intrinsic sphincter deficiency (ISD): Secondary | ICD-10-CM | POA: Diagnosis not present

## 2023-03-22 DIAGNOSIS — N3946 Mixed incontinence: Secondary | ICD-10-CM | POA: Diagnosis not present

## 2023-03-22 DIAGNOSIS — N3642 Intrinsic sphincter deficiency (ISD): Secondary | ICD-10-CM | POA: Diagnosis not present

## 2023-03-22 DIAGNOSIS — N952 Postmenopausal atrophic vaginitis: Secondary | ICD-10-CM | POA: Diagnosis not present

## 2023-03-22 DIAGNOSIS — N390 Urinary tract infection, site not specified: Secondary | ICD-10-CM | POA: Diagnosis not present

## 2023-03-22 DIAGNOSIS — N362 Urethral caruncle: Secondary | ICD-10-CM | POA: Diagnosis not present

## 2023-03-29 DIAGNOSIS — R059 Cough, unspecified: Secondary | ICD-10-CM | POA: Diagnosis not present

## 2023-03-29 DIAGNOSIS — E119 Type 2 diabetes mellitus without complications: Secondary | ICD-10-CM | POA: Diagnosis not present

## 2023-03-29 DIAGNOSIS — K219 Gastro-esophageal reflux disease without esophagitis: Secondary | ICD-10-CM | POA: Diagnosis not present

## 2023-03-29 DIAGNOSIS — F419 Anxiety disorder, unspecified: Secondary | ICD-10-CM | POA: Diagnosis not present

## 2023-03-29 DIAGNOSIS — R3981 Functional urinary incontinence: Secondary | ICD-10-CM | POA: Diagnosis not present

## 2023-03-30 DIAGNOSIS — Z1231 Encounter for screening mammogram for malignant neoplasm of breast: Secondary | ICD-10-CM | POA: Diagnosis not present

## 2023-05-13 DIAGNOSIS — R519 Headache, unspecified: Secondary | ICD-10-CM | POA: Diagnosis not present

## 2023-05-13 DIAGNOSIS — F419 Anxiety disorder, unspecified: Secondary | ICD-10-CM | POA: Diagnosis not present

## 2023-05-13 DIAGNOSIS — R531 Weakness: Secondary | ICD-10-CM | POA: Diagnosis not present

## 2023-05-13 DIAGNOSIS — K219 Gastro-esophageal reflux disease without esophagitis: Secondary | ICD-10-CM | POA: Diagnosis not present

## 2023-05-13 DIAGNOSIS — R059 Cough, unspecified: Secondary | ICD-10-CM | POA: Diagnosis not present

## 2023-05-13 DIAGNOSIS — R0789 Other chest pain: Secondary | ICD-10-CM | POA: Diagnosis not present

## 2023-05-13 DIAGNOSIS — E119 Type 2 diabetes mellitus without complications: Secondary | ICD-10-CM | POA: Diagnosis not present

## 2023-05-13 DIAGNOSIS — W19XXXA Unspecified fall, initial encounter: Secondary | ICD-10-CM | POA: Diagnosis not present

## 2023-05-17 DIAGNOSIS — R600 Localized edema: Secondary | ICD-10-CM | POA: Diagnosis not present

## 2023-05-17 DIAGNOSIS — I82402 Acute embolism and thrombosis of unspecified deep veins of left lower extremity: Secondary | ICD-10-CM | POA: Diagnosis not present

## 2023-06-06 DIAGNOSIS — N3946 Mixed incontinence: Secondary | ICD-10-CM | POA: Diagnosis not present

## 2023-06-10 DIAGNOSIS — I509 Heart failure, unspecified: Secondary | ICD-10-CM | POA: Diagnosis not present

## 2023-06-10 DIAGNOSIS — I10 Essential (primary) hypertension: Secondary | ICD-10-CM | POA: Diagnosis not present

## 2023-06-10 DIAGNOSIS — E118 Type 2 diabetes mellitus with unspecified complications: Secondary | ICD-10-CM | POA: Diagnosis not present

## 2023-06-13 DIAGNOSIS — N3946 Mixed incontinence: Secondary | ICD-10-CM | POA: Diagnosis not present

## 2023-06-19 DIAGNOSIS — E1165 Type 2 diabetes mellitus with hyperglycemia: Secondary | ICD-10-CM | POA: Diagnosis not present

## 2023-06-19 DIAGNOSIS — Z20822 Contact with and (suspected) exposure to covid-19: Secondary | ICD-10-CM | POA: Diagnosis not present

## 2023-06-19 DIAGNOSIS — R918 Other nonspecific abnormal finding of lung field: Secondary | ICD-10-CM | POA: Diagnosis not present

## 2023-06-19 DIAGNOSIS — Z7982 Long term (current) use of aspirin: Secondary | ICD-10-CM | POA: Diagnosis not present

## 2023-06-19 DIAGNOSIS — R5383 Other fatigue: Secondary | ICD-10-CM | POA: Diagnosis not present

## 2023-06-19 DIAGNOSIS — Z794 Long term (current) use of insulin: Secondary | ICD-10-CM | POA: Diagnosis not present

## 2023-06-19 DIAGNOSIS — R739 Hyperglycemia, unspecified: Secondary | ICD-10-CM | POA: Diagnosis not present

## 2023-06-20 DIAGNOSIS — E162 Hypoglycemia, unspecified: Secondary | ICD-10-CM | POA: Diagnosis not present

## 2023-06-20 DIAGNOSIS — R059 Cough, unspecified: Secondary | ICD-10-CM | POA: Diagnosis not present

## 2023-06-20 DIAGNOSIS — R3981 Functional urinary incontinence: Secondary | ICD-10-CM | POA: Diagnosis not present

## 2023-06-20 DIAGNOSIS — J449 Chronic obstructive pulmonary disease, unspecified: Secondary | ICD-10-CM | POA: Diagnosis not present

## 2023-06-20 DIAGNOSIS — E119 Type 2 diabetes mellitus without complications: Secondary | ICD-10-CM | POA: Diagnosis not present

## 2023-07-18 DIAGNOSIS — E119 Type 2 diabetes mellitus without complications: Secondary | ICD-10-CM | POA: Diagnosis not present

## 2023-07-18 DIAGNOSIS — M5459 Other low back pain: Secondary | ICD-10-CM | POA: Diagnosis not present

## 2023-07-18 DIAGNOSIS — R42 Dizziness and giddiness: Secondary | ICD-10-CM | POA: Diagnosis not present

## 2023-07-18 DIAGNOSIS — E1165 Type 2 diabetes mellitus with hyperglycemia: Secondary | ICD-10-CM | POA: Diagnosis not present

## 2023-07-18 DIAGNOSIS — I10 Essential (primary) hypertension: Secondary | ICD-10-CM | POA: Diagnosis not present

## 2023-07-18 DIAGNOSIS — F419 Anxiety disorder, unspecified: Secondary | ICD-10-CM | POA: Diagnosis not present

## 2023-07-18 DIAGNOSIS — R269 Unspecified abnormalities of gait and mobility: Secondary | ICD-10-CM | POA: Diagnosis not present

## 2023-07-18 DIAGNOSIS — B3731 Acute candidiasis of vulva and vagina: Secondary | ICD-10-CM | POA: Diagnosis not present

## 2023-07-18 DIAGNOSIS — N390 Urinary tract infection, site not specified: Secondary | ICD-10-CM | POA: Diagnosis not present

## 2023-07-18 DIAGNOSIS — R1311 Dysphagia, oral phase: Secondary | ICD-10-CM | POA: Diagnosis not present

## 2023-08-29 DIAGNOSIS — E119 Type 2 diabetes mellitus without complications: Secondary | ICD-10-CM | POA: Diagnosis not present

## 2023-08-29 DIAGNOSIS — R3981 Functional urinary incontinence: Secondary | ICD-10-CM | POA: Diagnosis not present

## 2023-08-29 DIAGNOSIS — F419 Anxiety disorder, unspecified: Secondary | ICD-10-CM | POA: Diagnosis not present

## 2023-08-29 DIAGNOSIS — M5459 Other low back pain: Secondary | ICD-10-CM | POA: Diagnosis not present

## 2023-08-29 DIAGNOSIS — R059 Cough, unspecified: Secondary | ICD-10-CM | POA: Diagnosis not present

## 2023-10-04 DIAGNOSIS — R42 Dizziness and giddiness: Secondary | ICD-10-CM | POA: Diagnosis not present

## 2023-10-04 DIAGNOSIS — Z6828 Body mass index (BMI) 28.0-28.9, adult: Secondary | ICD-10-CM | POA: Diagnosis not present

## 2023-10-04 DIAGNOSIS — R269 Unspecified abnormalities of gait and mobility: Secondary | ICD-10-CM | POA: Diagnosis not present

## 2023-10-04 DIAGNOSIS — N390 Urinary tract infection, site not specified: Secondary | ICD-10-CM | POA: Diagnosis not present

## 2023-10-04 DIAGNOSIS — R3981 Functional urinary incontinence: Secondary | ICD-10-CM | POA: Diagnosis not present

## 2023-10-04 DIAGNOSIS — E119 Type 2 diabetes mellitus without complications: Secondary | ICD-10-CM | POA: Diagnosis not present

## 2023-10-04 DIAGNOSIS — R059 Cough, unspecified: Secondary | ICD-10-CM | POA: Diagnosis not present

## 2023-10-04 DIAGNOSIS — R0789 Other chest pain: Secondary | ICD-10-CM | POA: Diagnosis not present

## 2023-10-04 DIAGNOSIS — R1311 Dysphagia, oral phase: Secondary | ICD-10-CM | POA: Diagnosis not present

## 2023-10-05 DIAGNOSIS — E119 Type 2 diabetes mellitus without complications: Secondary | ICD-10-CM | POA: Diagnosis not present

## 2023-11-01 DIAGNOSIS — Z0000 Encounter for general adult medical examination without abnormal findings: Secondary | ICD-10-CM | POA: Diagnosis not present

## 2023-11-01 DIAGNOSIS — E119 Type 2 diabetes mellitus without complications: Secondary | ICD-10-CM | POA: Diagnosis not present

## 2023-11-01 DIAGNOSIS — R519 Headache, unspecified: Secondary | ICD-10-CM | POA: Diagnosis not present

## 2023-11-01 DIAGNOSIS — R2242 Localized swelling, mass and lump, left lower limb: Secondary | ICD-10-CM | POA: Diagnosis not present

## 2023-11-01 DIAGNOSIS — R269 Unspecified abnormalities of gait and mobility: Secondary | ICD-10-CM | POA: Diagnosis not present

## 2023-11-30 DIAGNOSIS — D510 Vitamin B12 deficiency anemia due to intrinsic factor deficiency: Secondary | ICD-10-CM | POA: Diagnosis not present

## 2023-11-30 DIAGNOSIS — E7801 Familial hypercholesterolemia: Secondary | ICD-10-CM | POA: Diagnosis not present

## 2023-11-30 DIAGNOSIS — E559 Vitamin D deficiency, unspecified: Secondary | ICD-10-CM | POA: Diagnosis not present

## 2023-11-30 DIAGNOSIS — I10 Essential (primary) hypertension: Secondary | ICD-10-CM | POA: Diagnosis not present

## 2023-11-30 DIAGNOSIS — E039 Hypothyroidism, unspecified: Secondary | ICD-10-CM | POA: Diagnosis not present

## 2023-11-30 DIAGNOSIS — E1165 Type 2 diabetes mellitus with hyperglycemia: Secondary | ICD-10-CM | POA: Diagnosis not present

## 2023-12-06 DIAGNOSIS — R0789 Other chest pain: Secondary | ICD-10-CM | POA: Diagnosis not present

## 2023-12-06 DIAGNOSIS — R600 Localized edema: Secondary | ICD-10-CM | POA: Diagnosis not present

## 2023-12-06 DIAGNOSIS — E162 Hypoglycemia, unspecified: Secondary | ICD-10-CM | POA: Diagnosis not present

## 2023-12-06 DIAGNOSIS — R519 Headache, unspecified: Secondary | ICD-10-CM | POA: Diagnosis not present

## 2023-12-06 DIAGNOSIS — E119 Type 2 diabetes mellitus without complications: Secondary | ICD-10-CM | POA: Diagnosis not present

## 2024-01-03 DIAGNOSIS — E119 Type 2 diabetes mellitus without complications: Secondary | ICD-10-CM | POA: Diagnosis not present

## 2024-01-05 DIAGNOSIS — R600 Localized edema: Secondary | ICD-10-CM | POA: Diagnosis not present

## 2024-01-05 DIAGNOSIS — R0789 Other chest pain: Secondary | ICD-10-CM | POA: Diagnosis not present

## 2024-01-05 DIAGNOSIS — R519 Headache, unspecified: Secondary | ICD-10-CM | POA: Diagnosis not present

## 2024-01-05 DIAGNOSIS — E119 Type 2 diabetes mellitus without complications: Secondary | ICD-10-CM | POA: Diagnosis not present

## 2024-01-07 DIAGNOSIS — E041 Nontoxic single thyroid nodule: Secondary | ICD-10-CM | POA: Diagnosis not present

## 2024-01-07 DIAGNOSIS — M542 Cervicalgia: Secondary | ICD-10-CM | POA: Diagnosis not present

## 2024-01-07 DIAGNOSIS — S299XXA Unspecified injury of thorax, initial encounter: Secondary | ICD-10-CM | POA: Diagnosis not present

## 2024-01-07 DIAGNOSIS — S0093XA Contusion of unspecified part of head, initial encounter: Secondary | ICD-10-CM | POA: Diagnosis not present

## 2024-01-07 DIAGNOSIS — W0110XA Fall on same level from slipping, tripping and stumbling with subsequent striking against unspecified object, initial encounter: Secondary | ICD-10-CM | POA: Diagnosis not present

## 2024-01-07 DIAGNOSIS — S0990XA Unspecified injury of head, initial encounter: Secondary | ICD-10-CM | POA: Diagnosis not present

## 2024-01-07 DIAGNOSIS — R519 Headache, unspecified: Secondary | ICD-10-CM | POA: Diagnosis not present

## 2024-01-17 DIAGNOSIS — H903 Sensorineural hearing loss, bilateral: Secondary | ICD-10-CM | POA: Diagnosis not present

## 2024-01-17 DIAGNOSIS — H6123 Impacted cerumen, bilateral: Secondary | ICD-10-CM | POA: Diagnosis not present

## 2024-01-20 DIAGNOSIS — H903 Sensorineural hearing loss, bilateral: Secondary | ICD-10-CM | POA: Diagnosis not present

## 2024-01-20 DIAGNOSIS — H6123 Impacted cerumen, bilateral: Secondary | ICD-10-CM | POA: Diagnosis not present

## 2024-02-01 ENCOUNTER — Telehealth: Payer: Self-pay

## 2024-02-01 NOTE — Progress Notes (Signed)
   02/01/2024  Patient ID: Wendy Fuller, female   DOB: April 11, 1941, 83 y.o.   MRN: [id number]  Reviewed patient regarding medication adherence from a quality report for [facility]. The patient is listed as at-risk [facility] for 2025.    Per [facility] and payer portal fill history:  1. Valsartan 80 mg - last filled 01/03/24 for a 90-day supply.  2. Atorvastatin 10 mg - last filled 10/17/23 for a 90-day supply.  Left HIPAA compliant voicemail. I will follow up for adherence monitoring.  Thank you for allowing pharmacy to be a part of this patient's care.    Livia Riffle, PharmD Clinical Pharmacist  [phone]

## 2024-02-06 NOTE — Progress Notes (Signed)
   02/06/2024  Patient ID: Wendy Fuller, female   DOB: [birth year], 83 y.o.   MRN: [id number]  Reviewed patient regarding medication adherence from a quality report for [facility]. The patient is listed as at-risk [facility] for 2025.    Left HIPAA compliant voicemail. I will follow up for adherence monitoring.  Thank you for allowing pharmacy to be a part of this patient's care.    Livia Riffle, PharmD Clinical Pharmacist  [phone]

## 2024-02-17 NOTE — Progress Notes (Signed)
   02/17/2024  Patient ID: Wendy Fuller, female   DOB: [birth year], 83 y.o.   MRN: [id number]  Reviewed patient regarding medication adherence from a quality report for [facility]. The patient is listed as at-risk [facility] for 2025.    Left HIPAA compliant voicemail. I will follow up for adherence monitoring.  Thank you for allowing pharmacy to be a part of this patient's care.    Heather Factor, PharmD Clinical Pharmacist  [phone]

## 2024-02-20 DIAGNOSIS — B999 Unspecified infectious disease: Secondary | ICD-10-CM | POA: Diagnosis not present

## 2024-02-20 DIAGNOSIS — N390 Urinary tract infection, site not specified: Secondary | ICD-10-CM | POA: Diagnosis not present

## 2024-02-20 DIAGNOSIS — R202 Paresthesia of skin: Secondary | ICD-10-CM | POA: Diagnosis not present

## 2024-02-20 DIAGNOSIS — R109 Unspecified abdominal pain: Secondary | ICD-10-CM | POA: Diagnosis not present

## 2024-02-20 DIAGNOSIS — R195 Other fecal abnormalities: Secondary | ICD-10-CM | POA: Diagnosis not present

## 2024-02-20 DIAGNOSIS — E119 Type 2 diabetes mellitus without complications: Secondary | ICD-10-CM | POA: Diagnosis not present

## 2024-02-20 DIAGNOSIS — R350 Frequency of micturition: Secondary | ICD-10-CM | POA: Diagnosis not present

## 2024-02-20 DIAGNOSIS — R296 Repeated falls: Secondary | ICD-10-CM | POA: Diagnosis not present

## 2024-02-29 NOTE — Progress Notes (Signed)
   02/29/2024  Patient ID: Jenkins ONEIDA Domino, female   DOB: 1941/06/27, 83 y.o.   MRN: [id number]  Reviewed patient regarding medication adherence from a quality report for [facility]. The patient is listed as at-risk [facility] for 2025.    Left HIPAA compliant voicemail. I will follow up for adherence monitoring.  Thank you for allowing pharmacy to be a part of this patient's care.    Heather Factor, PharmD Clinical Pharmacist  [phone]

## 2024-03-27 DIAGNOSIS — R197 Diarrhea, unspecified: Secondary | ICD-10-CM | POA: Diagnosis not present

## 2024-03-27 DIAGNOSIS — D519 Vitamin B12 deficiency anemia, unspecified: Secondary | ICD-10-CM | POA: Diagnosis not present

## 2024-03-27 DIAGNOSIS — E119 Type 2 diabetes mellitus without complications: Secondary | ICD-10-CM | POA: Diagnosis not present

## 2024-03-27 DIAGNOSIS — R195 Other fecal abnormalities: Secondary | ICD-10-CM | POA: Diagnosis not present

## 2024-03-27 DIAGNOSIS — D509 Iron deficiency anemia, unspecified: Secondary | ICD-10-CM | POA: Diagnosis not present

## 2024-04-02 DIAGNOSIS — E119 Type 2 diabetes mellitus without complications: Secondary | ICD-10-CM | POA: Diagnosis not present

## 2024-04-24 DIAGNOSIS — I6782 Cerebral ischemia: Secondary | ICD-10-CM | POA: Diagnosis not present

## 2024-04-24 DIAGNOSIS — G3184 Mild cognitive impairment, so stated: Secondary | ICD-10-CM | POA: Diagnosis not present

## 2024-04-24 DIAGNOSIS — R296 Repeated falls: Secondary | ICD-10-CM | POA: Diagnosis not present

## 2024-04-30 DIAGNOSIS — H903 Sensorineural hearing loss, bilateral: Secondary | ICD-10-CM | POA: Diagnosis not present

## 2024-04-30 DIAGNOSIS — E119 Type 2 diabetes mellitus without complications: Secondary | ICD-10-CM | POA: Diagnosis not present

## 2024-04-30 DIAGNOSIS — R42 Dizziness and giddiness: Secondary | ICD-10-CM | POA: Diagnosis not present

## 2024-05-08 DIAGNOSIS — E119 Type 2 diabetes mellitus without complications: Secondary | ICD-10-CM | POA: Diagnosis not present

## 2024-06-19 DIAGNOSIS — E119 Type 2 diabetes mellitus without complications: Secondary | ICD-10-CM | POA: Diagnosis not present

## 2024-06-19 DIAGNOSIS — H903 Sensorineural hearing loss, bilateral: Secondary | ICD-10-CM | POA: Diagnosis not present

## 2024-06-19 DIAGNOSIS — R5383 Other fatigue: Secondary | ICD-10-CM | POA: Diagnosis not present

## 2024-06-19 DIAGNOSIS — M25562 Pain in left knee: Secondary | ICD-10-CM | POA: Diagnosis not present

## 2024-06-19 DIAGNOSIS — Z6825 Body mass index (BMI) 25.0-25.9, adult: Secondary | ICD-10-CM | POA: Diagnosis not present

## 2024-06-21 DIAGNOSIS — S82092A Other fracture of left patella, initial encounter for closed fracture: Secondary | ICD-10-CM | POA: Diagnosis not present

## 2024-07-02 DIAGNOSIS — E119 Type 2 diabetes mellitus without complications: Secondary | ICD-10-CM | POA: Diagnosis not present

## 2024-07-09 DIAGNOSIS — S82092D Other fracture of left patella, subsequent encounter for closed fracture with routine healing: Secondary | ICD-10-CM | POA: Diagnosis not present

## 2024-07-30 DIAGNOSIS — D509 Iron deficiency anemia, unspecified: Secondary | ICD-10-CM | POA: Diagnosis not present

## 2024-07-30 DIAGNOSIS — R5383 Other fatigue: Secondary | ICD-10-CM | POA: Diagnosis not present

## 2024-07-30 DIAGNOSIS — G3184 Mild cognitive impairment, so stated: Secondary | ICD-10-CM | POA: Diagnosis not present
